# Patient Record
Sex: Female | Born: 1970 | Race: Black or African American | Hispanic: No | Marital: Single | Smoking: Current some day smoker
Health system: Southern US, Community
[De-identification: ages and names within clinical notes are randomized; demographics above are authoritative.]

## PROBLEM LIST (undated history)

## (undated) DIAGNOSIS — G8929 Other chronic pain: Secondary | ICD-10-CM

## (undated) DIAGNOSIS — M549 Dorsalgia, unspecified: Secondary | ICD-10-CM

## (undated) DIAGNOSIS — F419 Anxiety disorder, unspecified: Secondary | ICD-10-CM

## (undated) DIAGNOSIS — Z9229 Personal history of other drug therapy: Secondary | ICD-10-CM

## (undated) DIAGNOSIS — F329 Major depressive disorder, single episode, unspecified: Secondary | ICD-10-CM

## (undated) DIAGNOSIS — F32A Depression, unspecified: Secondary | ICD-10-CM

## (undated) HISTORY — DX: Personal history of other drug therapy: Z92.29

---

## 1998-01-26 ENCOUNTER — Emergency Department (HOSPITAL_COMMUNITY): Admission: EM | Admit: 1998-01-26 | Discharge: 1998-01-26 | Payer: Self-pay | Admitting: Emergency Medicine

## 1998-10-03 ENCOUNTER — Inpatient Hospital Stay (HOSPITAL_COMMUNITY): Admission: AD | Admit: 1998-10-03 | Discharge: 1998-10-03 | Payer: Self-pay | Admitting: *Deleted

## 1998-10-29 ENCOUNTER — Inpatient Hospital Stay (HOSPITAL_COMMUNITY): Admission: EM | Admit: 1998-10-29 | Discharge: 1998-10-31 | Payer: Self-pay | Admitting: Emergency Medicine

## 1998-10-29 ENCOUNTER — Encounter: Payer: Self-pay | Admitting: Emergency Medicine

## 1999-02-26 ENCOUNTER — Emergency Department (HOSPITAL_COMMUNITY): Admission: EM | Admit: 1999-02-26 | Discharge: 1999-02-26 | Payer: Self-pay | Admitting: Emergency Medicine

## 1999-03-03 ENCOUNTER — Emergency Department (HOSPITAL_COMMUNITY): Admission: EM | Admit: 1999-03-03 | Discharge: 1999-03-03 | Payer: Self-pay

## 1999-05-26 ENCOUNTER — Emergency Department (HOSPITAL_COMMUNITY): Admission: EM | Admit: 1999-05-26 | Discharge: 1999-05-26 | Payer: Self-pay

## 1999-07-09 ENCOUNTER — Encounter: Payer: Self-pay | Admitting: Emergency Medicine

## 1999-07-09 ENCOUNTER — Emergency Department (HOSPITAL_COMMUNITY): Admission: EM | Admit: 1999-07-09 | Discharge: 1999-07-09 | Payer: Self-pay | Admitting: Emergency Medicine

## 1999-07-13 ENCOUNTER — Emergency Department (HOSPITAL_COMMUNITY): Admission: EM | Admit: 1999-07-13 | Discharge: 1999-07-13 | Payer: Self-pay | Admitting: Emergency Medicine

## 1999-07-13 ENCOUNTER — Encounter: Payer: Self-pay | Admitting: Emergency Medicine

## 2000-11-08 ENCOUNTER — Emergency Department (HOSPITAL_COMMUNITY): Admission: EM | Admit: 2000-11-08 | Discharge: 2000-11-08 | Payer: Self-pay | Admitting: Emergency Medicine

## 2001-08-21 ENCOUNTER — Emergency Department (HOSPITAL_COMMUNITY): Admission: EM | Admit: 2001-08-21 | Discharge: 2001-08-21 | Payer: Self-pay | Admitting: Emergency Medicine

## 2003-07-05 ENCOUNTER — Emergency Department (HOSPITAL_COMMUNITY): Admission: EM | Admit: 2003-07-05 | Discharge: 2003-07-05 | Payer: Self-pay | Admitting: Emergency Medicine

## 2003-12-20 ENCOUNTER — Emergency Department (HOSPITAL_COMMUNITY): Admission: EM | Admit: 2003-12-20 | Discharge: 2003-12-20 | Payer: Self-pay | Admitting: Emergency Medicine

## 2005-01-17 ENCOUNTER — Emergency Department (HOSPITAL_COMMUNITY): Admission: EM | Admit: 2005-01-17 | Discharge: 2005-01-18 | Payer: Self-pay | Admitting: Emergency Medicine

## 2006-01-23 ENCOUNTER — Emergency Department (HOSPITAL_COMMUNITY): Admission: EM | Admit: 2006-01-23 | Discharge: 2006-01-23 | Payer: Self-pay | Admitting: Emergency Medicine

## 2006-09-18 ENCOUNTER — Ambulatory Visit (HOSPITAL_COMMUNITY): Admission: RE | Admit: 2006-09-18 | Discharge: 2006-09-18 | Payer: Self-pay | Admitting: Family Medicine

## 2006-11-17 ENCOUNTER — Ambulatory Visit (HOSPITAL_COMMUNITY): Admission: RE | Admit: 2006-11-17 | Discharge: 2006-11-17 | Payer: Self-pay | Admitting: Family Medicine

## 2007-01-12 ENCOUNTER — Ambulatory Visit: Payer: Self-pay | Admitting: *Deleted

## 2007-01-12 ENCOUNTER — Inpatient Hospital Stay (HOSPITAL_COMMUNITY): Admission: AD | Admit: 2007-01-12 | Discharge: 2007-01-14 | Payer: Self-pay | Admitting: Obstetrics & Gynecology

## 2009-03-21 ENCOUNTER — Emergency Department (HOSPITAL_COMMUNITY): Admission: EM | Admit: 2009-03-21 | Discharge: 2009-03-21 | Payer: Self-pay | Admitting: Emergency Medicine

## 2009-03-22 ENCOUNTER — Emergency Department (HOSPITAL_COMMUNITY): Admission: EM | Admit: 2009-03-22 | Discharge: 2009-03-22 | Payer: Self-pay | Admitting: Emergency Medicine

## 2010-11-12 LAB — CBC
HCT: 31.5 — ABNORMAL LOW
Hemoglobin: 10.7 — ABNORMAL LOW
MCHC: 34
MCV: 83.3
Platelets: 338
RBC: 3.79 — ABNORMAL LOW
RDW: 13.7
WBC: 8.7

## 2010-11-12 LAB — RPR: RPR Ser Ql: NONREACTIVE

## 2010-12-01 ENCOUNTER — Emergency Department (HOSPITAL_COMMUNITY)
Admission: EM | Admit: 2010-12-01 | Discharge: 2010-12-01 | Disposition: A | Discharge status: Home / Self Care | Payer: Medicaid Other | Attending: Emergency Medicine | Admitting: Emergency Medicine

## 2010-12-01 DIAGNOSIS — T887XXA Unspecified adverse effect of drug or medicament, initial encounter: Secondary | ICD-10-CM | POA: Insufficient documentation

## 2010-12-01 DIAGNOSIS — I10 Essential (primary) hypertension: Secondary | ICD-10-CM | POA: Insufficient documentation

## 2010-12-01 DIAGNOSIS — F17200 Nicotine dependence, unspecified, uncomplicated: Secondary | ICD-10-CM | POA: Insufficient documentation

## 2012-05-04 ENCOUNTER — Emergency Department (INDEPENDENT_AMBULATORY_CARE_PROVIDER_SITE_OTHER)
Admission: EM | Admit: 2012-05-04 | Discharge: 2012-05-04 | Disposition: A | Discharge status: Home / Self Care | Payer: Medicaid Other | Source: Home / Self Care | Attending: Family Medicine | Admitting: Family Medicine

## 2012-05-04 ENCOUNTER — Encounter (HOSPITAL_COMMUNITY): Payer: Self-pay | Admitting: Emergency Medicine

## 2012-05-04 DIAGNOSIS — M7711 Lateral epicondylitis, right elbow: Secondary | ICD-10-CM

## 2012-05-04 DIAGNOSIS — M7710 Lateral epicondylitis, unspecified elbow: Secondary | ICD-10-CM

## 2012-05-04 LAB — POCT PREGNANCY, URINE: Preg Test, Ur: NEGATIVE

## 2012-05-04 MED ORDER — LIDOCAINE 5 % EX PTCH: MEDICATED_PATCH | CUTANEOUS | Status: DC

## 2012-05-04 MED ORDER — IBUPROFEN 600 MG PO TABS: 600.0000 mg | ORAL_TABLET | Freq: Three times a day (TID) | ORAL | Status: DC | PRN

## 2012-05-04 MED ORDER — TRAMADOL HCL 50 MG PO TABS: 50.0000 mg | ORAL_TABLET | Freq: Three times a day (TID) | ORAL | Status: DC | PRN
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
## 2012-05-04 NOTE — ED Notes (Signed)
Sent to bathroom for specimen 

## 2012-05-04 NOTE — ED Notes (Signed)
Pharmacy reported insurance will not cover script.  Dr Alfonse Ras changed script to ointment 5%, apply dime size amount twice a day, 1 tube.

## 2012-05-04 NOTE — ED Notes (Signed)
Patient concerned she may be pregnant.
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
## 2012-05-20 ENCOUNTER — Encounter (HOSPITAL_COMMUNITY): Payer: Self-pay | Admitting: Family Medicine

## 2012-05-20 ENCOUNTER — Emergency Department (HOSPITAL_COMMUNITY)
Admission: EM | Admit: 2012-05-20 | Discharge: 2012-05-20 | Disposition: A | Discharge status: Home / Self Care | Payer: Medicaid Other | Attending: Emergency Medicine | Admitting: Emergency Medicine

## 2012-05-20 DIAGNOSIS — N390 Urinary tract infection, site not specified: Secondary | ICD-10-CM

## 2012-05-20 DIAGNOSIS — R350 Frequency of micturition: Secondary | ICD-10-CM | POA: Insufficient documentation

## 2012-05-20 DIAGNOSIS — N898 Other specified noninflammatory disorders of vagina: Secondary | ICD-10-CM | POA: Insufficient documentation

## 2012-05-20 DIAGNOSIS — Z3202 Encounter for pregnancy test, result negative: Secondary | ICD-10-CM | POA: Insufficient documentation

## 2012-05-20 DIAGNOSIS — F17200 Nicotine dependence, unspecified, uncomplicated: Secondary | ICD-10-CM | POA: Insufficient documentation

## 2012-05-20 LAB — COMPREHENSIVE METABOLIC PANEL
ALT: 12 U/L (ref 0–35)
AST: 22 U/L (ref 0–37)
Albumin: 3.7 g/dL (ref 3.5–5.2)
Alkaline Phosphatase: 62 U/L (ref 39–117)
BUN: 8 mg/dL (ref 6–23)
CO2: 28 mEq/L (ref 19–32)
Calcium: 9.5 mg/dL (ref 8.4–10.5)
Chloride: 101 mEq/L (ref 96–112)
Creatinine, Ser: 0.84 mg/dL (ref 0.50–1.10)
GFR calc Af Amer: >90 mL/min (ref >90)
GFR calc non Af Amer: 86 mL/min — ABNORMAL LOW (ref >90)
Glucose, Bld: 113 mg/dL — ABNORMAL HIGH (ref 70–99)
Potassium: 4 mEq/L (ref 3.5–5.1)
Total Bilirubin: 0.3 mg/dL (ref 0.3–1.2)
Total Protein: 7.7 g/dL (ref 6.0–8.3)

## 2012-05-20 LAB — CBC WITH DIFFERENTIAL/PLATELET
Basophils Absolute: 0 10*3/uL (ref 0.0–0.1)
Basophils Relative: 0 % (ref 0–1)
Eosinophils Relative: 0 % (ref 0–5)
HCT: 39.2 % (ref 36.0–46.0)
Hemoglobin: 13.3 g/dL (ref 12.0–15.0)
Lymphocytes Relative: 23 % (ref 12–46)
Lymphs Abs: 2.2 10*3/uL (ref 0.7–4.0)
MCH: 28.2 pg (ref 26.0–34.0)
MCHC: 33.9 g/dL (ref 30.0–36.0)
MCV: 83.1 fL (ref 78.0–100.0)
Monocytes Absolute: 0.6 10*3/uL (ref 0.1–1.0)
Monocytes Relative: 6 % (ref 3–12)
Neutro Abs: 6.5 10*3/uL (ref 1.7–7.7)
Neutrophils Relative %: 70 % (ref 43–77)
Platelets: 312 10*3/uL (ref 150–400)
RBC: 4.72 MIL/uL (ref 3.87–5.11)
RDW: 13.7 % (ref 11.5–15.5)
WBC: 9.3 10*3/uL (ref 4.0–10.5)

## 2012-05-20 LAB — URINE MICROSCOPIC-ADD ON

## 2012-05-20 LAB — WET PREP, GENITAL
Trich, Wet Prep: NONE SEEN
WBC, Wet Prep HPF POC: NONE SEEN
Yeast Wet Prep HPF POC: NONE SEEN

## 2012-05-20 LAB — URINALYSIS, ROUTINE W REFLEX MICROSCOPIC
Glucose, UA: NEGATIVE mg/dL
Hgb urine dipstick: NEGATIVE
Ketones, ur: 15 mg/dL — AB
Leukocytes, UA: NEGATIVE
Nitrite: POSITIVE — AB
Protein, ur: NEGATIVE mg/dL
Specific Gravity, Urine: 1.023 (ref 1.005–1.030)
Urobilinogen, UA: 1 mg/dL (ref 0.0–1.0)

## 2012-05-20 MED ORDER — CIPROFLOXACIN HCL 500 MG PO TABS: 500.0000 mg | ORAL_TABLET | Freq: Two times a day (BID) | ORAL | Status: DC

## 2012-05-20 MED ORDER — PHENAZOPYRIDINE HCL 200 MG PO TABS: 200.0000 mg | ORAL_TABLET | Freq: Three times a day (TID) | ORAL | Status: DC

## 2012-05-20 NOTE — ED Provider Notes (Signed)
Medical screening examination/treatment/procedure(s) were performed by non-physician practitioner and as supervising physician I was immediately available for consultation/collaboration.   Dione Booze, MD 05/20/12 2017

## 2012-05-20 NOTE — ED Notes (Signed)
Pt reports bilateral flank pain since 0900 today that radiates forward to pelvis. Denies dysuria, hematuria but reports frequency. Pt concerned she could be pregnant. Denies N/V/D. States her pain is better when she lays down on her right, but incredibly worse when she lays on her left.

## 2012-05-20 NOTE — ED Notes (Signed)
Per pt sts abdominal pain all over that started this am. sts February last menstrual. sts last BM yesterday and normal. Denies N,V,D.

## 2012-05-20 NOTE — ED Notes (Signed)
PA at bedside.

## 2012-05-20 NOTE — ED Notes (Signed)
Pelvic cart at bedside. 

## 2012-05-20 NOTE — ED Provider Notes (Signed)
History     CSN: [id number]  Arrival date & time 05/20/12  1445   First MD Initiated Contact with Patient 05/20/12 [phone]      Chief Complaint  Patient presents with  . Abdominal Pain    (Consider location/radiation/quality/duration/timing/severity/associated sxs/prior treatment) HPI Comments: Patient presents to the emergency department for diffuse, cramping, abdominal pain of sudden onset this morning. Patient notes she has had increased frequency of urination as well as new vaginal discharge. Discharge is clear in appearance and without odor. New sexual partner beginning March 2014.  Pt is unsure of STD status at this time.  Patient denies any dysuria, nausea, vomiting, diarrhea, sweats, or chills.  Reports recent fever of 102F, easily relieved with Tylenol.  LMP February 2014.  No history of kidney stones.  The history is provided by the patient.    History reviewed. No pertinent past medical history.  History reviewed. No pertinent past surgical history.  History reviewed. No pertinent family history.  History  Substance Use Topics  . Smoking status: Current Every Day Smoker  . Smokeless tobacco: Not on file  . Alcohol Use: No    OB History   Grav Para Term Preterm Abortions TAB SAB Ect Mult Living                  Review of Systems  Gastrointestinal: Positive for abdominal pain.  Genitourinary: Positive for frequency and flank pain.  All other systems reviewed and are negative.    Allergies  Flexeril and Penicillins  Home Medications   Current Outpatient Rx  Name  Route  Sig  Dispense  Refill  . ibuprofen (ADVIL,MOTRIN) 600 MG tablet   Oral   Take 1 tablet (600 mg total) by mouth every 8 (eight) hours as needed for pain.   20 tablet   0   . lidocaine (LIDODERM) 5 %      Cut patch in half an apply over the affected area Remove & Discard patch before applying new one in 12 hours.   7 patch   0   . traMADol (ULTRAM) 50 MG tablet   Oral   Take 1  tablet (50 mg total) by mouth every 8 (eight) hours as needed for pain.   20 tablet   0     BP 103/65  Pulse 85  Temp(Src) 97.8 F (36.6 C) (Oral)  Resp 18  SpO2 96%  LMP 03/16/2012  Physical Exam  Nursing note and vitals reviewed. Constitutional: She is oriented to person, place, and time. She appears well-developed and well-nourished.  HENT:  Head: Normocephalic and atraumatic.  Mouth/Throat: Oropharynx is clear and moist.  Eyes: Conjunctivae and EOM are normal. Pupils are equal, round, and reactive to light.  Neck: Normal range of motion.  Cardiovascular: Normal rate, regular rhythm and normal heart sounds.   Pulmonary/Chest: Effort normal and breath sounds normal.  Abdominal: Soft. Bowel sounds are normal. There is generalized tenderness. There is CVA tenderness (R > L). There is no tenderness at McBurney's point and negative Murphy's sign.  Genitourinary: Vagina normal. There is no lesion on the right labia. There is no lesion on the left labia. Cervix exhibits no motion tenderness. Right adnexum displays no mass and no tenderness. Left adnexum displays no mass and no tenderness. No tenderness around the vagina.  Musculoskeletal: Normal range of motion.  Neurological: She is alert and oriented to person, place, and time.  Skin: Skin is warm and dry.  Psychiatric: She has a normal mood  and affect.    ED Course  Procedures (including critical care time)  Labs Reviewed  WET PREP, GENITAL - Abnormal; Notable for the following:    Clue Cells Wet Prep HPF POC FEW (*)    All other components within normal limits  COMPREHENSIVE METABOLIC PANEL - Abnormal; Notable for the following:    Glucose, Bld 113 (*)    GFR calc non Af Amer 86 (*)    All other components within normal limits  URINALYSIS, ROUTINE W REFLEX MICROSCOPIC - Abnormal; Notable for the following:    APPearance CLOUDY (*)    Ketones, ur 15 (*)    Nitrite POSITIVE (*)    All other components within normal limits   URINE MICROSCOPIC-ADD ON - Abnormal; Notable for the following:    Bacteria, UA MANY (*)    All other components within normal limits  GC/CHLAMYDIA PROBE AMP  URINE CULTURE  CBC WITH DIFFERENTIAL  LIPASE, BLOOD  POCT PREGNANCY, URINE   No results found.   1. Urinary tract infection       MDM   Patient presents to the emergency department for diffuse abdominal pain of sudden onset this am.  Also notes increased urinary frequency and new vaginal discharge.  New sexual partner for the past month.   U-preg negative.  U/a nitrite +, culture pending.  Wet prep with few clue cells, however i do not feel this indicates infection.  Pt afebrile, non-toxic appearing, VS stable- will d/c home with tx for UTI.  Rx cipro and pyridium.  Advised pt she will be notified if her culture results are abnormal or require treatment.  Discussed plan with pt- she agreed.  Return precautions advised.        Garlon Hatchet, PA-C 05/20/12 1836

## 2012-05-21 LAB — GC/CHLAMYDIA PROBE AMP
CT Probe RNA: NEGATIVE
GC Probe RNA: NEGATIVE

## 2012-05-22 LAB — URINE CULTURE: Colony Count: >=100000

## 2012-05-23 ENCOUNTER — Telehealth (HOSPITAL_COMMUNITY): Payer: Self-pay | Admitting: Emergency Medicine

## 2012-05-23 NOTE — ED Notes (Signed)
Positive urnc- treated per protocol.  

## 2012-07-13 ENCOUNTER — Emergency Department (HOSPITAL_COMMUNITY)
Admission: EM | Admit: 2012-07-13 | Discharge: 2012-07-13 | Discharge status: Against Medical Advice | Payer: Medicaid Other | Attending: Emergency Medicine | Admitting: Emergency Medicine

## 2012-07-13 ENCOUNTER — Encounter (HOSPITAL_COMMUNITY): Payer: Self-pay | Admitting: *Deleted

## 2012-07-13 ENCOUNTER — Emergency Department (HOSPITAL_COMMUNITY): Payer: Medicaid Other

## 2012-07-13 DIAGNOSIS — H538 Other visual disturbances: Secondary | ICD-10-CM | POA: Insufficient documentation

## 2012-07-13 DIAGNOSIS — IMO0002 Reserved for concepts with insufficient information to code with codable children: Secondary | ICD-10-CM | POA: Insufficient documentation

## 2012-07-13 DIAGNOSIS — Z3202 Encounter for pregnancy test, result negative: Secondary | ICD-10-CM | POA: Insufficient documentation

## 2012-07-13 DIAGNOSIS — S59909A Unspecified injury of unspecified elbow, initial encounter: Secondary | ICD-10-CM | POA: Insufficient documentation

## 2012-07-13 DIAGNOSIS — Z880 Allergy status to penicillin: Secondary | ICD-10-CM | POA: Insufficient documentation

## 2012-07-13 DIAGNOSIS — S59919A Unspecified injury of unspecified forearm, initial encounter: Secondary | ICD-10-CM | POA: Insufficient documentation

## 2012-07-13 DIAGNOSIS — S0990XA Unspecified injury of head, initial encounter: Secondary | ICD-10-CM | POA: Insufficient documentation

## 2012-07-13 DIAGNOSIS — S6990XA Unspecified injury of unspecified wrist, hand and finger(s), initial encounter: Secondary | ICD-10-CM | POA: Insufficient documentation

## 2012-07-13 DIAGNOSIS — R42 Dizziness and giddiness: Secondary | ICD-10-CM | POA: Insufficient documentation

## 2012-07-13 DIAGNOSIS — F17200 Nicotine dependence, unspecified, uncomplicated: Secondary | ICD-10-CM | POA: Insufficient documentation

## 2012-07-13 LAB — POCT PREGNANCY, URINE: Preg Test, Ur: NEGATIVE

## 2012-07-13 MED ORDER — LORAZEPAM 1 MG PO TABS
1.0000 mg | ORAL_TABLET | Freq: Once | ORAL | Status: AC
  Administered 2012-07-13: 1 mg via ORAL
  Filled 2012-07-13: qty 1

## 2012-07-13 MED ORDER — OXYCODONE-ACETAMINOPHEN 5-325 MG PO TABS
2.0000 | ORAL_TABLET | Freq: Once | ORAL | Status: AC
  Administered 2012-07-13: 2 via ORAL
  Filled 2012-07-13: qty 2
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
## 2012-07-13 NOTE — ED Notes (Signed)
Pt states she's unsure of when last period was, states thinks it was march, unsure if she's pregnant.
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
## 2012-07-13 NOTE — ED Notes (Signed)
Pt left AMA, states "I have a 40 year old to get home to and I need to get on the bus and get home, I can't stay", pt walked out of room and left, pt made aware she had xrays and CT scan ordered, pt states "I will just have to come back tomorrow for that, I have to get home now". Pt ambulated w/o difficulty when leaving. Melina Schools, PA made aware pt left AMA.
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
## 2012-07-14 ENCOUNTER — Ambulatory Visit (HOSPITAL_COMMUNITY): Admission: RE | Admit: 2012-07-14 | Payer: Medicaid Other | Source: Ambulatory Visit

## 2012-07-16 NOTE — ED Provider Notes (Signed)
Medical screening examination/treatment/procedure(s) were performed by non-physician practitioner and as supervising physician I was immediately available for consultation/collaboration.  Raeford Razor, MD 07/16/12 [phone]

## 2012-07-20 ENCOUNTER — Emergency Department (HOSPITAL_COMMUNITY): Payer: Medicaid Other

## 2012-07-20 ENCOUNTER — Encounter (HOSPITAL_COMMUNITY): Payer: Self-pay | Admitting: Emergency Medicine

## 2012-07-20 ENCOUNTER — Emergency Department (HOSPITAL_COMMUNITY)
Admission: EM | Admit: 2012-07-20 | Discharge: 2012-07-21 | Disposition: A | Discharge status: Home / Self Care | Payer: Medicaid Other | Attending: Emergency Medicine | Admitting: Emergency Medicine

## 2012-07-20 DIAGNOSIS — S6980XA Other specified injuries of unspecified wrist, hand and finger(s), initial encounter: Secondary | ICD-10-CM | POA: Insufficient documentation

## 2012-07-20 DIAGNOSIS — Z760 Encounter for issue of repeat prescription: Secondary | ICD-10-CM | POA: Insufficient documentation

## 2012-07-20 DIAGNOSIS — M79645 Pain in left finger(s): Secondary | ICD-10-CM

## 2012-07-20 DIAGNOSIS — F329 Major depressive disorder, single episode, unspecified: Secondary | ICD-10-CM

## 2012-07-20 DIAGNOSIS — Z79899 Other long term (current) drug therapy: Secondary | ICD-10-CM | POA: Insufficient documentation

## 2012-07-20 DIAGNOSIS — Y939 Activity, unspecified: Secondary | ICD-10-CM | POA: Insufficient documentation

## 2012-07-20 DIAGNOSIS — Y929 Unspecified place or not applicable: Secondary | ICD-10-CM | POA: Insufficient documentation

## 2012-07-20 DIAGNOSIS — S6990XA Unspecified injury of unspecified wrist, hand and finger(s), initial encounter: Secondary | ICD-10-CM | POA: Insufficient documentation

## 2012-07-20 DIAGNOSIS — M25551 Pain in right hip: Secondary | ICD-10-CM

## 2012-07-20 DIAGNOSIS — S79919A Unspecified injury of unspecified hip, initial encounter: Secondary | ICD-10-CM | POA: Insufficient documentation

## 2012-07-20 DIAGNOSIS — F411 Generalized anxiety disorder: Secondary | ICD-10-CM | POA: Insufficient documentation

## 2012-07-20 DIAGNOSIS — F3289 Other specified depressive episodes: Secondary | ICD-10-CM | POA: Insufficient documentation

## 2012-07-20 DIAGNOSIS — F419 Anxiety disorder, unspecified: Secondary | ICD-10-CM

## 2012-07-20 DIAGNOSIS — F17200 Nicotine dependence, unspecified, uncomplicated: Secondary | ICD-10-CM | POA: Insufficient documentation

## 2012-07-20 DIAGNOSIS — W2203XA Walked into furniture, initial encounter: Secondary | ICD-10-CM | POA: Insufficient documentation

## 2012-07-20 MED ORDER — HYDROCODONE-ACETAMINOPHEN 5-325 MG PO TABS
1.0000 | ORAL_TABLET | Freq: Once | ORAL | Status: AC
  Administered 2012-07-20: 1 via ORAL
  Filled 2012-07-20: qty 1

## 2012-07-20 NOTE — ED Notes (Signed)
Patient transported to X-ray 
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
## 2012-07-21 MED ORDER — HYDROCODONE-ACETAMINOPHEN 5-325 MG PO TABS
1.0000 | ORAL_TABLET | Freq: Once | ORAL | Status: AC
  Administered 2012-07-21: 1 via ORAL
  Filled 2012-07-21: qty 1

## 2012-07-21 MED ORDER — FLUOXETINE HCL 20 MG PO TABS: 20.0000 mg | ORAL_TABLET | Freq: Every day | ORAL | Status: DC

## 2012-07-21 MED ORDER — TRAMADOL HCL 50 MG PO TABS: 50.0000 mg | ORAL_TABLET | Freq: Four times a day (QID) | ORAL | Status: DC | PRN

## 2012-07-21 MED ORDER — ALPRAZOLAM 1 MG PO TABS: 1.0000 mg | ORAL_TABLET | Freq: Every evening | ORAL | Status: DC | PRN

## 2012-07-21 NOTE — Progress Notes (Signed)
Orthopedic Tech Progress Note Patient Details:  Briana Harris 02-27-1970 [id number]  Ortho Devices Type of Ortho Device: Finger splint   Haskell Flirt 07/21/2012, 12:33 AM

## 2012-07-23 NOTE — ED Provider Notes (Signed)
Medical screening examination/treatment/procedure(s) were performed by non-physician practitioner and as supervising physician I was immediately available for consultation/collaboration.   Tiajuana Leppanen, MD 07/23/12 0709 

## 2012-12-02 ENCOUNTER — Encounter (HOSPITAL_COMMUNITY): Payer: Self-pay | Admitting: Emergency Medicine

## 2012-12-02 ENCOUNTER — Emergency Department (HOSPITAL_COMMUNITY)
Admission: EM | Admit: 2012-12-02 | Discharge: 2012-12-02 | Disposition: A | Discharge status: Home / Self Care | Payer: Medicaid Other | Attending: Emergency Medicine | Admitting: Emergency Medicine

## 2012-12-02 ENCOUNTER — Emergency Department (HOSPITAL_COMMUNITY): Payer: Medicaid Other

## 2012-12-02 DIAGNOSIS — S62639A Displaced fracture of distal phalanx of unspecified finger, initial encounter for closed fracture: Secondary | ICD-10-CM

## 2012-12-02 DIAGNOSIS — S6390XA Sprain of unspecified part of unspecified wrist and hand, initial encounter: Secondary | ICD-10-CM | POA: Insufficient documentation

## 2012-12-02 DIAGNOSIS — F411 Generalized anxiety disorder: Secondary | ICD-10-CM | POA: Insufficient documentation

## 2012-12-02 DIAGNOSIS — S60312A Abrasion of left thumb, initial encounter: Secondary | ICD-10-CM

## 2012-12-02 DIAGNOSIS — Z79899 Other long term (current) drug therapy: Secondary | ICD-10-CM | POA: Insufficient documentation

## 2012-12-02 DIAGNOSIS — F17200 Nicotine dependence, unspecified, uncomplicated: Secondary | ICD-10-CM | POA: Insufficient documentation

## 2012-12-02 DIAGNOSIS — Z880 Allergy status to penicillin: Secondary | ICD-10-CM | POA: Insufficient documentation

## 2012-12-02 DIAGNOSIS — S62609A Fracture of unspecified phalanx of unspecified finger, initial encounter for closed fracture: Secondary | ICD-10-CM | POA: Insufficient documentation

## 2012-12-02 DIAGNOSIS — Y929 Unspecified place or not applicable: Secondary | ICD-10-CM | POA: Insufficient documentation

## 2012-12-02 DIAGNOSIS — S63602A Unspecified sprain of left thumb, initial encounter: Secondary | ICD-10-CM

## 2012-12-02 DIAGNOSIS — Y9372 Activity, wrestling: Secondary | ICD-10-CM | POA: Insufficient documentation

## 2012-12-02 DIAGNOSIS — IMO0002 Reserved for concepts with insufficient information to code with codable children: Secondary | ICD-10-CM | POA: Insufficient documentation

## 2012-12-02 DIAGNOSIS — W2209XA Striking against other stationary object, initial encounter: Secondary | ICD-10-CM | POA: Insufficient documentation

## 2012-12-02 HISTORY — DX: Anxiety disorder, unspecified: F41.9

## 2012-12-02 MED ORDER — IBUPROFEN 800 MG PO TABS: 800.0000 mg | ORAL_TABLET | Freq: Three times a day (TID) | ORAL | Status: DC

## 2012-12-02 MED ORDER — IBUPROFEN 400 MG PO TABS
800.0000 mg | ORAL_TABLET | Freq: Once | ORAL | Status: AC
  Administered 2012-12-02: 800 mg via ORAL
  Filled 2012-12-02: qty 2

## 2012-12-02 MED ORDER — HYDROCODONE-ACETAMINOPHEN 5-325 MG PO TABS
1.0000 | ORAL_TABLET | Freq: Once | ORAL | Status: AC
  Administered 2012-12-02: 1 via ORAL
  Filled 2012-12-02: qty 1

## 2012-12-02 NOTE — ED Provider Notes (Signed)
CSN: [id number]     Arrival date & time 12/02/12  1758 History  This chart was scribed for non-physician practitioner Dierdre Forth, PA-C working with Audree Camel, MD by Danella Maiers, ED Scribe. This patient was seen in room TR04C/TR04C and the patient's care was started at 6:57 PM.   Chief Complaint  Patient presents with  . Hand Injury   The history is provided by the patient. No language interpreter was used.   HPI Comments: Briana Harris is a 42 y.o. female who presents to the Emergency Department complaining of constant, throbbing pain to her left thumb, left 3rd, and left 4th fingers after knocking her hand against the wall while wrestling with her boyfriend two hours ago. She denies pain or injury anywhere else. She has not taken anything for the pain. Movement and palpation makes the pain much worse on holding still makes it better.  She did not hit her loss of consciousness. Patient denies neck or back pain.  Past Medical History  Diagnosis Date  . Anxiety    History reviewed. No pertinent past surgical history. No family history on file. History  Substance Use Topics  . Smoking status: Current Every Day Smoker  . Smokeless tobacco: Never Used  . Alcohol Use: Yes   OB History   Grav Para Term Preterm Abortions TAB SAB Ect Mult Living                 Review of Systems  Constitutional: Negative for fever, diaphoresis, appetite change, fatigue and unexpected weight change.  HENT: Negative for mouth sores.   Eyes: Negative for visual disturbance.  Respiratory: Negative for cough, chest tightness, shortness of breath and wheezing.   Cardiovascular: Negative for chest pain.  Gastrointestinal: Negative for nausea, vomiting, abdominal pain, diarrhea and constipation.  Endocrine: Negative for polydipsia, polyphagia and polyuria.  Genitourinary: Negative for dysuria, urgency, frequency and hematuria.  Musculoskeletal: Positive for arthralgias (left hand). Negative for  back pain and neck stiffness.  Skin: Negative for rash.  Allergic/Immunologic: Negative for immunocompromised state.  Neurological: Negative for syncope, light-headedness and headaches.  Hematological: Does not bruise/bleed easily.  Psychiatric/Behavioral: Negative for sleep disturbance. The patient is not nervous/anxious.     Allergies  Flexeril; Lactose intolerance (gi); and Penicillins  Home Medications   Current Outpatient Rx  Name  Route  Sig  Dispense  Refill  . ALPRAZolam (XANAX) 1 MG tablet   Oral   Take 1 tablet (1 mg total) by mouth at bedtime as needed for sleep.   7 tablet   0   . FLUoxetine (PROZAC) 20 MG tablet   Oral   Take 1 tablet (20 mg total) by mouth daily.   7 tablet   0   . ibuprofen (ADVIL,MOTRIN) 800 MG tablet   Oral   Take 1 tablet (800 mg total) by mouth 3 (three) times daily.   21 tablet   0    BP 131/80  Pulse 74  Temp(Src) 98.3 F (36.8 C) (Oral)  Resp 16  Wt 107 lb 8 oz (48.762 kg)  SpO2 98% Physical Exam  Nursing note and vitals reviewed. Constitutional: She appears well-developed and well-nourished. No distress.  HENT:  Head: Normocephalic and atraumatic.  Eyes: Conjunctivae are normal.  Neck: Normal range of motion.  Cardiovascular: Normal rate, regular rhythm and intact distal pulses.   Capillary refill less than 3 sec  Pulmonary/Chest: Effort normal and breath sounds normal.  Musculoskeletal: She exhibits tenderness. She exhibits no edema.  ROM: sesnation in all fingers. decreased strength in thumb and ring finger due to pain but normal strength in all other fingers. Mildly limited ROM to the PIP of the ring finger and NCP of the thumb. Swelling and ecchymosis in both these fingers. Small abrasion of left thumb on the palmar side.  Neurological: She is alert. Coordination normal.  Sensation intact Strength 5/5  Skin: Skin is warm and dry. She is not diaphoretic.  No tenting of the skin  Psychiatric: She has a normal mood and  affect.    ED Course  Procedures (including critical care time) Medications  ibuprofen (ADVIL,MOTRIN) tablet 800 mg (800 mg Oral Given 12/02/12 2003)  HYDROcodone-acetaminophen (NORCO/VICODIN) 5-325 MG per tablet 1 tablet (1 tablet Oral Given 12/02/12 2003)    DIAGNOSTIC STUDIES: Oxygen Saturation is 98% on RA, normal by my interpretation.    COORDINATION OF CARE: 7:23 PM- Discussed treatment plan with pt which includes hand x-ray. Pt agrees to plan.    Labs Review Labs Reviewed - No data to display Imaging Review Dg Hand Complete Left  12/02/2012   CLINICAL DATA:  Left hand pain. Swelling of the left ring finger.  EXAM: LEFT HAND - COMPLETE 3+ VIEW  COMPARISON:  Left hand radiographs 07/20/2012  FINDINGS: The patient had difficulty extending the 4th digit (required assistance from the technologist (. The joints of the hand are aligned. There is a 2 x 2 mm bony fragment along the dorsal side of the 4th finger at the level of the distal interphalangeal joint. This fracture fragment likely arose from the distal phalanx. No additional fractures are identified.  IMPRESSION: Small fracture fragment adjacent to the distal interphalangeal joint of the 4th finger, likely arising from the base of the distal phalanx. Patient has difficulty extending the 4th finger.   Electronically Signed   By: Britta Mccreedy M.D.   On: 12/02/2012 19:41    EKG Interpretation   None       MDM   1. Fracture of finger, distal phalanx, left, closed, initial encounter   2. Abrasion of left thumb, initial encounter   3. Left thumb sprain, initial encounter     Briana Harris presents with left hand injury.  Patient X-Ray with small fracture fragments from the base of the distal phalanx of the fourth finger on the left hand, no further fractures noted on the hand.  I personally reviewed the imaging tests through PACS system.  I reviewed available ER/hospitalization records through the EMR.  Pain managed in ED. she  is neurovascularly intact. Pt advised to follow up with hand orthopedics for further evaluation and treatment.  Pain managed in the department. Patient given finger splint, thumb spica and arm sling while in ED, conservative therapy recommended and discussed. Patient will be dc home & is agreeable with above plan. I have also discussed reasons to return immediately to the ER.  Patient expresses understanding and agrees with plan.  It has been determined that no acute conditions requiring further emergency intervention are present at this time. The patient/guardian have been advised of the diagnosis and plan. We have discussed signs and symptoms that warrant return to the ED, such as changes or worsening in symptoms.   Vital signs are stable at discharge.   BP 131/80  Pulse 74  Temp(Src) 98.3 F (36.8 C) (Oral)  Resp 16  Wt 107 lb 8 oz (48.762 kg)  SpO2 98%  Patient/guardian has voiced understanding and agreed to follow-up with the PCP  or specialist.    I personally performed the services described in this documentation, which was scribed in my presence. The recorded information has been reviewed and is accurate.    Dahlia Client Jackquelyn Sundberg, PA-C 12/02/12 2037

## 2012-12-02 NOTE — ED Notes (Signed)
Has small open wound left thumb.

## 2012-12-02 NOTE — Progress Notes (Signed)
Orthopedic Tech Progress Note Patient Details:  Briana Harris [birth year] [id number]  Ortho Devices Type of Ortho Device: Finger splint;Thumb velcro splint Ortho Device/Splint Location: lue Ortho Device/Splint Interventions: Application   Briana Harris 12/02/2012, 8:25 PM

## 2012-12-02 NOTE — ED Notes (Signed)
The pt has lt hand pain .  She was wrestling with her boyfriend and has pain in her lt thumb middle and ring fingers

## 2012-12-04 NOTE — ED Provider Notes (Signed)
Medical screening examination/treatment/procedure(s) were performed by non-physician practitioner and as supervising physician I was immediately available for consultation/collaboration.  EKG Interpretation   None         Jahaan Vanwagner T Jay Kempe, MD 12/04/12 1736 

## 2012-12-11 ENCOUNTER — Emergency Department (HOSPITAL_COMMUNITY)
Admission: EM | Admit: 2012-12-11 | Discharge: 2012-12-11 | Discharge status: Against Medical Advice | Payer: Medicaid Other | Attending: Emergency Medicine | Admitting: Emergency Medicine

## 2012-12-11 ENCOUNTER — Encounter (HOSPITAL_COMMUNITY): Payer: Self-pay | Admitting: Emergency Medicine

## 2012-12-11 DIAGNOSIS — Z4800 Encounter for change or removal of nonsurgical wound dressing: Secondary | ICD-10-CM | POA: Insufficient documentation

## 2012-12-11 DIAGNOSIS — Z3202 Encounter for pregnancy test, result negative: Secondary | ICD-10-CM | POA: Insufficient documentation

## 2012-12-11 LAB — POCT PREGNANCY, URINE: Preg Test, Ur: NEGATIVE

## 2012-12-11 NOTE — ED Notes (Signed)
PT c/o vaginal dryness, states did not want to tell triage RN this and therefore initially stated wanted to have dressing changed. Pt reports having no menses since August, vaginal dryness x1 year. Pt denies vaginal itching/abnormal discharge or burning on urination. PA made aware

## 2012-12-11 NOTE — ED Notes (Signed)
Patient presents today requesting her bandage to be rewrapped and evaluated. Patient injured finger several weeks ago reports she fractured finger.

## 2014-04-13 ENCOUNTER — Emergency Department (HOSPITAL_COMMUNITY)
Admission: EM | Admit: 2014-04-13 | Discharge: 2014-04-13 | Disposition: A | Discharge status: Home / Self Care | Payer: Medicaid Other | Attending: Emergency Medicine | Admitting: Emergency Medicine

## 2014-04-13 ENCOUNTER — Emergency Department (HOSPITAL_COMMUNITY): Payer: Medicaid Other

## 2014-04-13 ENCOUNTER — Encounter (HOSPITAL_COMMUNITY): Payer: Self-pay | Admitting: Emergency Medicine

## 2014-04-13 DIAGNOSIS — J988 Other specified respiratory disorders: Secondary | ICD-10-CM | POA: Insufficient documentation

## 2014-04-13 DIAGNOSIS — R05 Cough: Secondary | ICD-10-CM | POA: Diagnosis present

## 2014-04-13 DIAGNOSIS — Z720 Tobacco use: Secondary | ICD-10-CM | POA: Insufficient documentation

## 2014-04-13 DIAGNOSIS — R51 Headache: Secondary | ICD-10-CM | POA: Insufficient documentation

## 2014-04-13 DIAGNOSIS — F419 Anxiety disorder, unspecified: Secondary | ICD-10-CM | POA: Insufficient documentation

## 2014-04-13 DIAGNOSIS — Z760 Encounter for issue of repeat prescription: Secondary | ICD-10-CM | POA: Diagnosis not present

## 2014-04-13 DIAGNOSIS — F329 Major depressive disorder, single episode, unspecified: Secondary | ICD-10-CM | POA: Insufficient documentation

## 2014-04-13 DIAGNOSIS — R109 Unspecified abdominal pain: Secondary | ICD-10-CM | POA: Insufficient documentation

## 2014-04-13 DIAGNOSIS — Z79899 Other long term (current) drug therapy: Secondary | ICD-10-CM | POA: Insufficient documentation

## 2014-04-13 DIAGNOSIS — Z880 Allergy status to penicillin: Secondary | ICD-10-CM | POA: Insufficient documentation

## 2014-04-13 HISTORY — DX: Major depressive disorder, single episode, unspecified: F32.9

## 2014-04-13 HISTORY — DX: Depression, unspecified: F32.A

## 2014-04-13 LAB — I-STAT CHEM 8, ED
BUN: 11 mg/dL (ref 6–23)
Calcium, Ion: 1.09 mmol/L — ABNORMAL LOW (ref 1.12–1.23)
Chloride: 99 mmol/L (ref 96–112)
Creatinine, Ser: 1 mg/dL (ref 0.50–1.10)
Glucose, Bld: 70 mg/dL (ref 70–99)
HCT: 42 % (ref 36.0–46.0)
Hemoglobin: 14.3 g/dL (ref 12.0–15.0)
Potassium: 3.9 mmol/L (ref 3.5–5.1)
Sodium: 134 mmol/L — ABNORMAL LOW (ref 135–145)
TCO2: 20 mmol/L (ref 0–100)

## 2014-04-13 MED ORDER — BENZONATATE 100 MG PO CAPS
100.0000 mg | ORAL_CAPSULE | Freq: Once | ORAL | Status: AC
  Administered 2014-04-13: 100 mg via ORAL
  Filled 2014-04-13: qty 1

## 2014-04-13 MED ORDER — ONDANSETRON 8 MG PO TBDP
8.0000 mg | ORAL_TABLET | Freq: Once | ORAL | Status: AC
  Administered 2014-04-13: 8 mg via ORAL
  Filled 2014-04-13: qty 1

## 2014-04-13 MED ORDER — AZITHROMYCIN 250 MG PO TABS
500.0000 mg | ORAL_TABLET | Freq: Once | ORAL | Status: AC
  Administered 2014-04-13: 500 mg via ORAL
  Filled 2014-04-13: qty 2

## 2014-04-13 MED ORDER — AZITHROMYCIN 250 MG PO TABS: 250.0000 mg | ORAL_TABLET | Freq: Every day | ORAL | Status: DC

## 2014-04-13 MED ORDER — ONDANSETRON HCL 4 MG PO TABS: 4.0000 mg | ORAL_TABLET | Freq: Three times a day (TID) | ORAL | Status: DC | PRN

## 2014-04-13 MED ORDER — ALBUTEROL SULFATE HFA 108 (90 BASE) MCG/ACT IN AERS
1.0000 | INHALATION_SPRAY | Freq: Four times a day (QID) | RESPIRATORY_TRACT | Status: DC
  Administered 2014-04-13: 1 via RESPIRATORY_TRACT
  Filled 2014-04-13: qty 6.7

## 2014-04-13 MED ORDER — BENZONATATE 100 MG PO CAPS: 100.0000 mg | ORAL_CAPSULE | Freq: Three times a day (TID) | ORAL | Status: DC

## 2014-04-13 NOTE — ED Provider Notes (Signed)
CSN: [id number]     Arrival date & time 04/13/14  1617 History   First MD Initiated Contact with Patient 04/13/14 1705     Chief Complaint  Patient presents with  . Cough  . flu like sx   . Medication Refill   HPI Comments: 44 YOF presents with 2 days of headache, fevers/chills, rhinorrhea, cough, abdominal pain, n/v, and body aches. She reports symptoms started all at once, and have not improved. She notes one episode of non-bloody vomiting today. She also notes that she has been having difficulty breathing due to nasal congestion. She describes that headache as diffuse pressure; denies photophobia, phonophobia, changes in vision, or any other sensory deficits. Denies neck stiffness. She reports a decreased appetite but states that she has been able to maintain adequate hydration. She denies diarrhea, changes in urinary frequency or symptoms. No close sick contacts. Her main concern is lack of sleep due to non-productive cough.  Patient is a 44 y.o. female presenting with cough.  Cough   Past Medical History  Diagnosis Date  . Anxiety   . Depression    History reviewed. No pertinent past surgical history. No family history on file. History  Substance Use Topics  . Smoking status: Current Every Day Smoker  . Smokeless tobacco: Never Used  . Alcohol Use: Yes   OB History    No data available     Review of Systems  Respiratory: Positive for cough.   All other systems reviewed and are negative.     Allergies  Flexeril; Lactose intolerance (gi); and Penicillins  Home Medications   Prior to Admission medications   Medication Sig Start Date End Date Taking? Authorizing Provider  acetaminophen (TYLENOL) 500 MG tablet Take 1,000 mg by mouth every 6 (six) hours as needed for mild pain or headache.   Yes Historical Provider, MD  ALPRAZolam Prudy Feeler) 1 MG tablet Take 1 tablet (1 mg total) by mouth at bedtime as needed for sleep. 07/21/12  Yes Jennifer Piepenbrink, PA-C  FLUoxetine  (PROZAC) 20 MG tablet Take 1 tablet (20 mg total) by mouth daily. 07/21/12  Yes Jennifer Piepenbrink, PA-C  traMADol (ULTRAM) 50 MG tablet Take 50 mg by mouth every 6 (six) hours as needed for moderate pain.   Yes Historical Provider, MD  ibuprofen (ADVIL,MOTRIN) 800 MG tablet Take 1 tablet (800 mg total) by mouth 3 (three) times daily. 12/02/12   Hannah Muthersbaugh, PA-C   BP 116/75 mmHg  Pulse 90  Temp(Src) 100.7 F (38.2 C) (Oral)  Resp 20  SpO2 97% Physical Exam  Constitutional: She is oriented to person, place, and time. She appears well-developed and well-nourished.  HENT:  Head: Normocephalic and atraumatic.  Right Ear: Hearing, tympanic membrane, external ear and ear canal normal.  Left Ear: Hearing, tympanic membrane, external ear and ear canal normal.  Nose: Rhinorrhea present.  Mouth/Throat: Uvula is midline, oropharynx is clear and moist and mucous membranes are normal. No oropharyngeal exudate, posterior oropharyngeal edema, posterior oropharyngeal erythema or tonsillar abscesses.  Eyes: Pupils are equal, round, and reactive to light.  Neck: Normal range of motion. Neck supple. No JVD present. No tracheal deviation present. No thyromegaly present.  Cardiovascular: Normal rate, regular rhythm, normal heart sounds and intact distal pulses.  Exam reveals no gallop and no friction rub.   No murmur heard. Pulmonary/Chest: Effort normal. No stridor. No respiratory distress. She has no decreased breath sounds. She has wheezes in the right upper field. She has no rales. She exhibits  no tenderness.  Musculoskeletal: Normal range of motion.  Lymphadenopathy:    She has no cervical adenopathy.  Neurological: She is alert and oriented to person, place, and time. Coordination normal.  Skin: Skin is warm and dry.  Psychiatric: She has a normal mood and affect. Her behavior is normal. Judgment and thought content normal.  Nursing note and vitals reviewed.   ED Course  Procedures  (including critical care time) Labs Review Labs Reviewed  I-STAT CHEM 8, ED    Imaging Review Dg Chest 2 View  04/13/2014   CLINICAL DATA:  Chills, headache and cough for 2 days.  EXAM: CHEST  2 VIEW  COMPARISON:  None.  FINDINGS: The heart size and mediastinal contours are within normal limits. Both lungs are clear. The visualized skeletal structures are unremarkable.  IMPRESSION: No active cardiopulmonary disease.   Electronically Signed   By: Richarda OverlieAdam  Henn M.D.   On: 04/13/2014 18:16     EKG Interpretation None      MDM   Final diagnoses:  Respiratory infection    Labs: chem 8 sodium 134  Imaging: normal  Therapeutics: albuterol; improved depth of respirations, decreased wheez  Assessment/ Plan: Respiratory infection likely viral in nature. Pt presented with a fever, cough, and fatigue. Concern for a secondary bacterial infection on Dxx. Pt was discharged home with cough medication, antibiotics, albuterol, and anti-nausea medications. She was instructed to use them as directed and to follow-up with her PCP. If symptoms do not improve or new or worsening symptoms present she was encouraged to return for further evaluation. She understood and agreed to the plan.        Eyvonne MechanicJeffrey Chasey Dull, PA-C 04/13/14 2055  Rolan BuccoMelanie Belfi, MD 04/13/14 2255

## 2014-04-13 NOTE — Discharge Instructions (Signed)
Viral Infections A viral infection can be caused by different types of viruses.Most viral infections are not serious and resolve on their own. However, some infections may cause severe symptoms and may lead to further complications. SYMPTOMS Viruses can frequently cause:  Minor sore throat.  Aches and pains.  Headaches.  Runny nose.  Different types of rashes.  Watery eyes.  Tiredness.  Cough.  Loss of appetite.  Gastrointestinal infections, resulting in nausea, vomiting, and diarrhea. These symptoms do not respond to antibiotics because the infection is not caused by bacteria. However, you might catch a bacterial infection following the viral infection. This is sometimes called a "superinfection." Symptoms of such a bacterial infection may include:  Worsening sore throat with pus and difficulty swallowing.  Swollen neck glands.  Chills and a high or persistent fever.  Severe headache.  Tenderness over the sinuses.  Persistent overall ill feeling (malaise), muscle aches, and tiredness (fatigue).  Persistent cough.  Yellow, green, or brown mucus production with coughing. HOME CARE INSTRUCTIONS   Only take over-the-counter or prescription medicines for pain, discomfort, diarrhea, or fever as directed by your caregiver.  Drink enough water and fluids to keep your urine clear or pale yellow. Sports drinks can provide valuable electrolytes, sugars, and hydration.  Get plenty of rest and maintain proper nutrition. Soups and broths with crackers or rice are fine. SEEK IMMEDIATE MEDICAL CARE IF:   You have severe headaches, shortness of breath, chest pain, neck pain, or an unusual rash.  You have uncontrolled vomiting, diarrhea, or you are unable to keep down fluids.  You or your child has an oral temperature above 102 F (38.9 C), not controlled by medicine.  Your baby is older than 3 months with a rectal temperature of 102 F (38.9 C) or higher.  Your baby is 333  months old or younger with a rectal temperature of 100.4 F (38 C) or higher. MAKE SURE YOU:   Understand these instructions.  Will watch your condition.  Will get help right away if you are not doing well or get worse. Document Released: 10/31/2004 Document Revised: 04/15/2011 Document Reviewed: 05/28/2010 [facility] Patient Information 2015 [place], [place]. This information is not intended to replace advice given to you by your health care provider. Make sure you discuss any questions you have with your health care provider.  Pt is advised to monitor for worsening signs of infection. If symptoms do not improve or worsen please seek further medical care.

## 2014-04-13 NOTE — ED Notes (Addendum)
Pt c/o flu-like sxs, cough, chills, sweats, head pains since Monday. Pt also states she needs refills on her xanax.

## 2014-04-27 IMAGING — CR DG HIP COMPLETE 2+V*R*
3 series · 3 of 3 positions shown · non-contrast
Comparison: No priors.

CLINICAL DATA: Injury complaining of right hip pain.

RIGHT HIP - COMPLETE 2+ VIEW

[t pelvis ap]
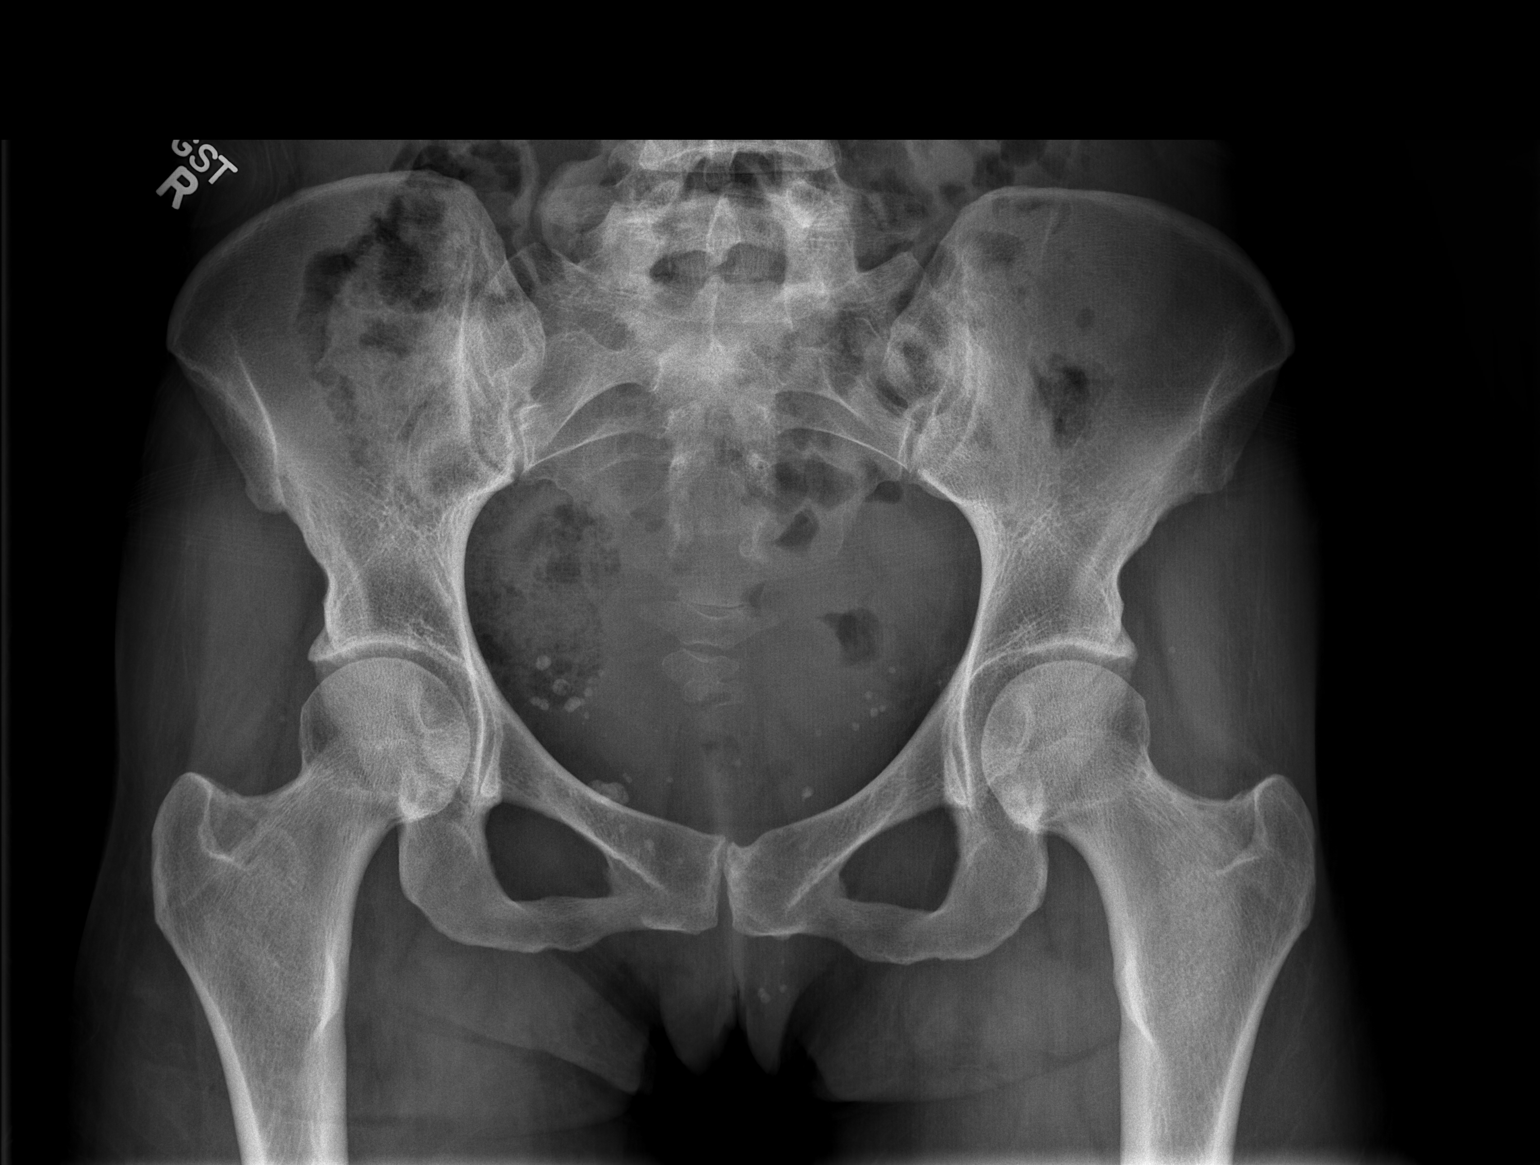

[t hip ap right]
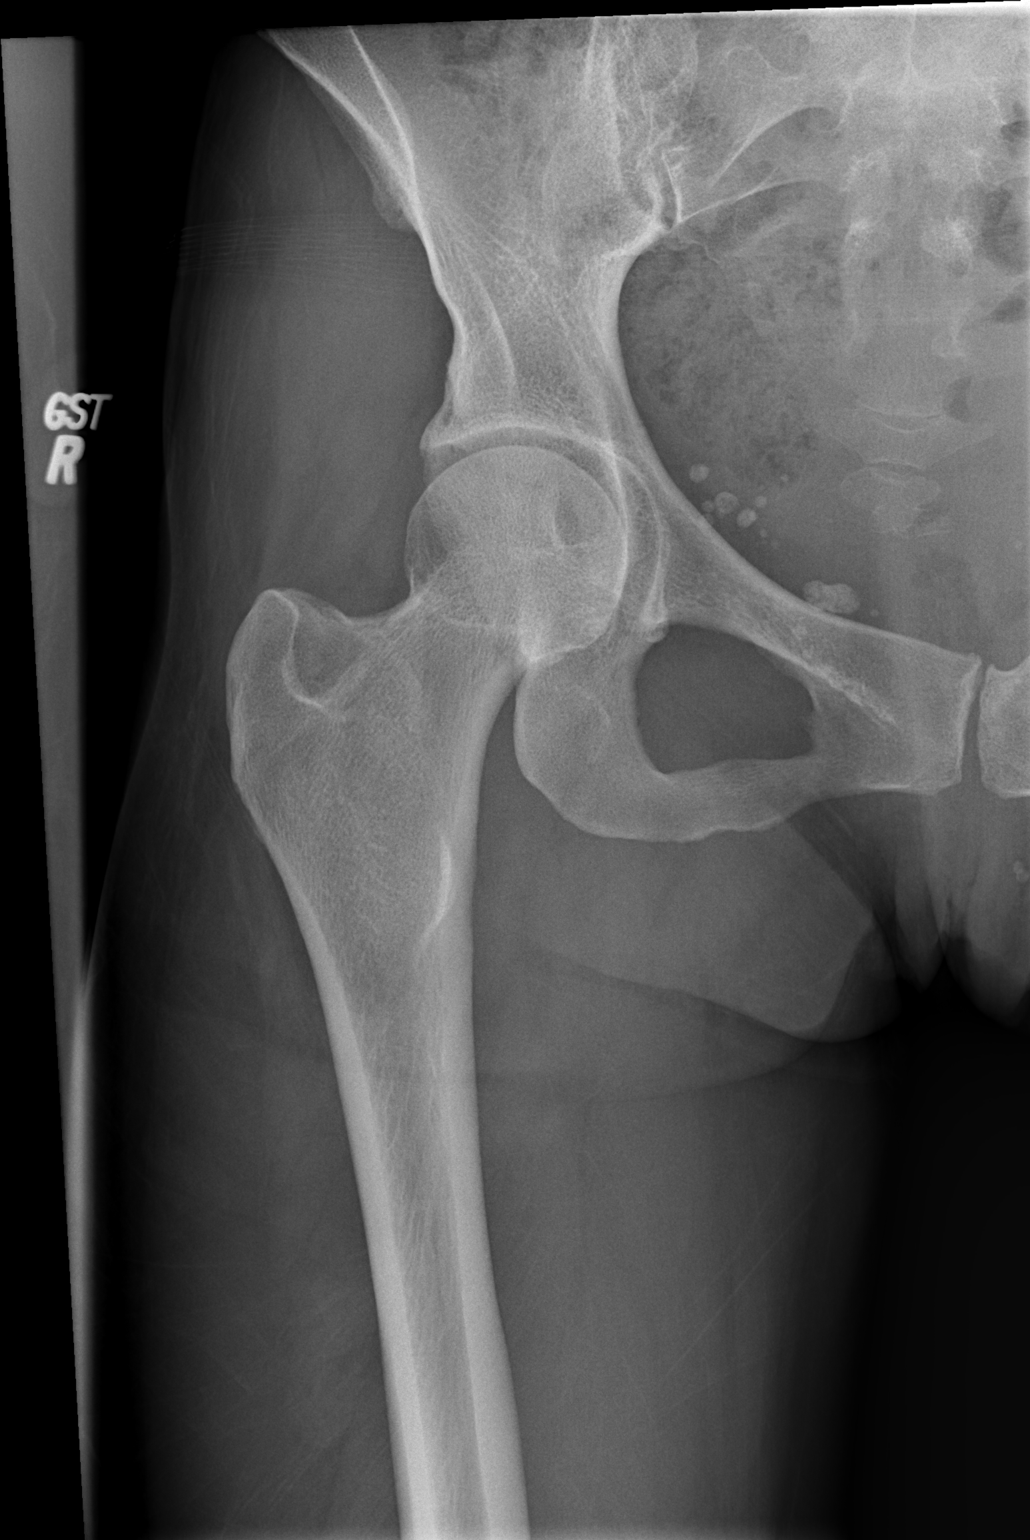

[t hip frog leg right]
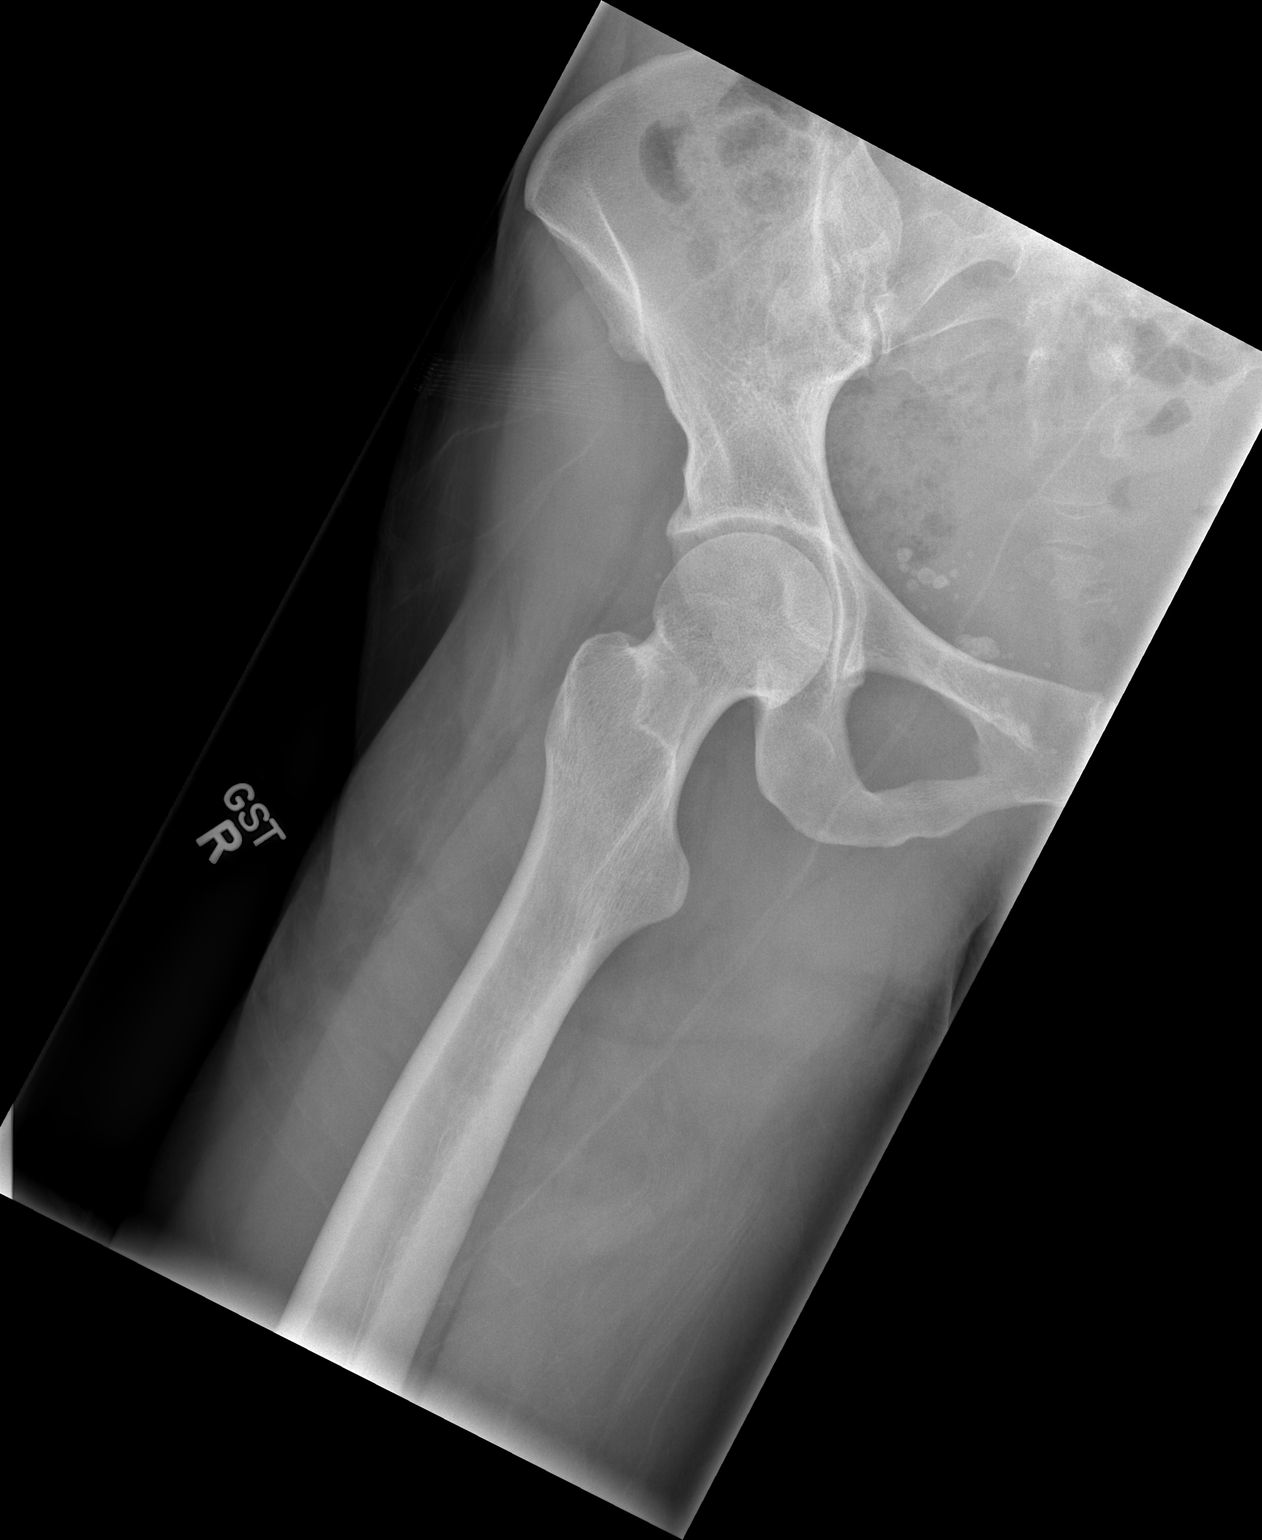

[3 of 3 positions shown; findings below may reference images not displayed]

FINDINGS: AP view of the pelvis and AP and lateral views of the
right hip demonstrate no definite acute displaced fracture of the
bony pelvis.  Bilateral proximal femurs as visualized appear
intact, and the right femoral head is properly located.  Numerous
pelvic phleboliths are incidentally noted.
IMPRESSION: 1.  No acute radiographic abnormality of the bony pelvis or right
hip.

## 2015-05-26 ENCOUNTER — Encounter (HOSPITAL_COMMUNITY): Payer: Self-pay

## 2015-05-26 ENCOUNTER — Emergency Department (HOSPITAL_COMMUNITY): Payer: Medicaid Other

## 2015-05-26 ENCOUNTER — Emergency Department (HOSPITAL_COMMUNITY)
Admission: EM | Admit: 2015-05-26 | Discharge: 2015-05-26 | Disposition: A | Discharge status: Home / Self Care | Payer: Medicaid Other | Attending: Emergency Medicine | Admitting: Emergency Medicine

## 2015-05-26 DIAGNOSIS — Z792 Long term (current) use of antibiotics: Secondary | ICD-10-CM | POA: Insufficient documentation

## 2015-05-26 DIAGNOSIS — Z79899 Other long term (current) drug therapy: Secondary | ICD-10-CM | POA: Diagnosis not present

## 2015-05-26 DIAGNOSIS — M545 Low back pain: Secondary | ICD-10-CM | POA: Diagnosis not present

## 2015-05-26 DIAGNOSIS — Z791 Long term (current) use of non-steroidal anti-inflammatories (NSAID): Secondary | ICD-10-CM | POA: Diagnosis not present

## 2015-05-26 DIAGNOSIS — F17210 Nicotine dependence, cigarettes, uncomplicated: Secondary | ICD-10-CM | POA: Diagnosis not present

## 2015-05-26 DIAGNOSIS — M5431 Sciatica, right side: Secondary | ICD-10-CM

## 2015-05-26 DIAGNOSIS — M79641 Pain in right hand: Secondary | ICD-10-CM | POA: Diagnosis not present

## 2015-05-26 DIAGNOSIS — F329 Major depressive disorder, single episode, unspecified: Secondary | ICD-10-CM | POA: Diagnosis not present

## 2015-05-26 HISTORY — DX: Dorsalgia, unspecified: M54.9

## 2015-05-26 HISTORY — DX: Other chronic pain: G89.29

## 2015-05-26 MED ORDER — OXYCODONE-ACETAMINOPHEN 5-325 MG PO TABS
1.0000 | ORAL_TABLET | Freq: Once | ORAL | Status: AC
  Administered 2015-05-26: 1 via ORAL
  Filled 2015-05-26: qty 1

## 2015-05-26 MED ORDER — KETOROLAC TROMETHAMINE 60 MG/2ML IM SOLN
60.0000 mg | Freq: Once | INTRAMUSCULAR | Status: DC
  Filled 2015-05-26: qty 2

## 2015-05-26 MED ORDER — DIAZEPAM 5 MG PO TABS
5.0000 mg | ORAL_TABLET | Freq: Once | ORAL | Status: AC
  Administered 2015-05-26: 5 mg via ORAL
  Filled 2015-05-26: qty 1

## 2015-05-26 MED ORDER — HYDROCODONE-ACETAMINOPHEN 5-325 MG PO TABS: 1.0000 | ORAL_TABLET | Freq: Four times a day (QID) | ORAL | Status: DC | PRN

## 2015-05-26 MED ORDER — DIAZEPAM 5 MG PO TABS: 5.0000 mg | ORAL_TABLET | Freq: Three times a day (TID) | ORAL | Status: DC | PRN

## 2015-05-26 MED ORDER — OXYCODONE-ACETAMINOPHEN 5-325 MG PO TABS
1.0000 | ORAL_TABLET | ORAL | Status: AC | PRN
  Administered 2015-05-26 (×2): 1 via ORAL
  Filled 2015-05-26 (×2): qty 1

## 2015-05-26 MED ORDER — PREDNISONE 50 MG PO TABS: 50.0000 mg | ORAL_TABLET | Freq: Every day | ORAL | Status: DC

## 2015-05-26 MED ORDER — PREDNISONE 20 MG PO TABS
60.0000 mg | ORAL_TABLET | Freq: Once | ORAL | Status: AC
  Administered 2015-05-26: 60 mg via ORAL
  Filled 2015-05-26: qty 3

## 2015-05-26 NOTE — ED Notes (Signed)
Pain medication given in Triage. Patient advised about side effects of medications and  to avoid driving for a minimum of 4 hours.  

## 2015-05-26 NOTE — ED Notes (Addendum)
Per EMS, Pt, from home/hotel, c/o increasing low back pain and spasms x 1 day and sharp Bilateral upper leg pain and R hand pain starting this evening.  Pain score 10/10.  Pt reports that she was moving furniture yesterday.  R hand pain began after hitting it on the Nurses' station.  Pt has not taken anything for pain.  Hx of anxiety and Pt is hyperventilating during assessment.

## 2015-05-26 NOTE — Discharge Instructions (Signed)
Return here as needed.  Follow-up with a primary care doctor.  Your x-rays did not show any abnormalities °

## 2015-05-26 NOTE — ED Provider Notes (Signed)
CSN: [id number]     Arrival date & time 05/26/15  1638 History   First MD Initiated Contact with Patient 05/26/15 1724     Chief Complaint  Patient presents with  . Back Pain  . Hand Pain    HPI The patient is a 45 year old black female PMH of OCD traits, depression, and panic attacks who presents with acute lower back pain. The patient was lifting heavy furniture yesterday with no complaints or issues at the time. Today midday she endorses shooting pain from her lower back down her legs, numbness and tingling down legs bilaterally. States weakness with difficulty ambulating, describes pain as 10/10 in lumbosacral area. Denies nausea, vomiting, headaches, fever, warmth to the area, urinary or bladder incontinence or focal neurological deficits.   Past Medical History  Diagnosis Date  . Anxiety   . Depression   . Chronic back pain    History reviewed. No pertinent past surgical history. History reviewed. No pertinent family history. Social History  Substance Use Topics  . Smoking status: Current Every Day Smoker -- 2.00 packs/day    Types: Cigarettes  . Smokeless tobacco: Never Used  . Alcohol Use: No   OB History    No data available     Review of Systems All pertinent ROS as above in HPI   Allergies  Flexeril; Lactose intolerance (gi); and Penicillins  Home Medications   Prior to Admission medications   Medication Sig Start Date End Date Taking? Authorizing Provider  ALPRAZolam Prudy Feeler(XANAX) 1 MG tablet Take 1 tablet (1 mg total) by mouth at bedtime as needed for sleep. 07/21/12  Yes Jennifer Piepenbrink, PA-C  oxyCODONE-acetaminophen (PERCOCET) 10-325 MG tablet Take 1 tablet by mouth every 6 (six) hours as needed for pain.   Yes Historical Provider, MD  azithromycin (ZITHROMAX) 250 MG tablet Take 1 tablet (250 mg total) by mouth daily. Take one tablet daily for the next 4 days Patient not taking: Reported on 05/26/2015 04/13/14   Eyvonne MechanicJeffrey Hedges, PA-C  benzonatate (TESSALON) 100  MG capsule Take 1 capsule (100 mg total) by mouth every 8 (eight) hours. Patient not taking: Reported on 05/26/2015 04/13/14   Eyvonne MechanicJeffrey Hedges, PA-C  FLUoxetine (PROZAC) 20 MG tablet Take 1 tablet (20 mg total) by mouth daily. Patient not taking: Reported on 05/26/2015 07/21/12   Francee PiccoloJennifer Piepenbrink, PA-C  ibuprofen (ADVIL,MOTRIN) 800 MG tablet Take 1 tablet (800 mg total) by mouth 3 (three) times daily. Patient not taking: Reported on 05/26/2015 12/02/12   Dahlia ClientHannah Muthersbaugh, PA-C  ondansetron (ZOFRAN) 4 MG tablet Take 1 tablet (4 mg total) by mouth every 8 (eight) hours as needed for nausea or vomiting. Patient not taking: Reported on 05/26/2015 04/13/14   Marlon Peliffany Greene, PA-C   BP 114/70 mmHg  Pulse 85  Temp(Src) 98.2 F (36.8 C) (Oral)  Resp 22  SpO2 100%  LMP 07/20/2012 Physical Exam  Constitutional: She is oriented to person, place, and time. She appears well-developed and well-nourished. She appears distressed.  Patient crying on exam, slow motions due to intolerable pain  HENT:  Head: Normocephalic and atraumatic.  Mouth/Throat: Oropharynx is clear and moist.  Eyes: Pupils are equal, round, and reactive to light.  Neck: Normal range of motion. Neck supple.  Cardiovascular: Normal rate, regular rhythm, normal heart sounds and intact distal pulses.  Exam reveals no gallop and no friction rub.   No murmur heard. Pulmonary/Chest: Effort normal and breath sounds normal. No respiratory distress. She has no wheezes. She has no rales.  Musculoskeletal: She exhibits no edema.  Limited ROM due to pain. Positive straight leg raise to 10 degrees flexion. Pain with lumbar flexion and extension.  Patient unable to perform balance on one leg due to inability to raise legs due to pain.   Neurological: She is alert and oriented to person, place, and time. She has normal reflexes. No cranial nerve deficit. She exhibits normal muscle tone. Coordination normal.  Sensation and motor control intact in  bilateral lower extremities  Skin: Skin is warm and dry. No rash noted. No erythema.  Nursing note and vitals reviewed.   ED Course  Procedures (including critical care time) Labs Review Labs Reviewed - No data to display  Imaging Review Dg Hand Complete Right  05/26/2015  CLINICAL DATA:  Right hand crushing injury today between wheelchair and door. Pain, swelling, and abrasion of right hand. Initial encounter. EXAM: RIGHT HAND - COMPLETE 3+ VIEW COMPARISON:  None. FINDINGS: There is no evidence of fracture or dislocation. There is no evidence of arthropathy or other focal bone abnormality. Soft tissues are unremarkable. IMPRESSION: Negative. Electronically Signed   By: Myles Rosenthal M.D.   On: 05/26/2015 17:26   I have personally reviewed and evaluated these images and lab results as part of my medical decision-making.   MDM   The patient presents with acute lower back pain, medication ordered for pain control. Hx telling for possible disc herniation with radiculopathy vs lumbosacral MSK strain. Patient seems to respond well to pain medications, ordered Xray to r/o acute fractures or DJD causing acute pain at this time. Xray of lumbar spine shows no acute bone abnormalities, no fractures noted. Will send patient home on anti-inflammatories and muscle relaxants for pain and spasms. Patient does not have any neurological deficits and can walk without difficulty.  Patient is advised to follow-up with her primary care doctor   Charlestine Night, PA-C 05/27/15 1610  Lyndal Pulley, MD 05/28/15 [phone]

## 2015-08-30 ENCOUNTER — Emergency Department (HOSPITAL_COMMUNITY): Payer: Medicaid Other

## 2015-08-30 ENCOUNTER — Encounter (HOSPITAL_COMMUNITY): Payer: Self-pay

## 2015-08-30 ENCOUNTER — Emergency Department (HOSPITAL_COMMUNITY)
Admission: EM | Admit: 2015-08-30 | Discharge: 2015-08-30 | Disposition: A | Discharge status: Home / Self Care | Payer: Medicaid Other | Attending: Emergency Medicine | Admitting: Emergency Medicine

## 2015-08-30 DIAGNOSIS — W19XXXA Unspecified fall, initial encounter: Secondary | ICD-10-CM

## 2015-08-30 DIAGNOSIS — Y939 Activity, unspecified: Secondary | ICD-10-CM | POA: Insufficient documentation

## 2015-08-30 DIAGNOSIS — M7660 Achilles tendinitis, unspecified leg: Secondary | ICD-10-CM | POA: Insufficient documentation

## 2015-08-30 DIAGNOSIS — T63461A Toxic effect of venom of wasps, accidental (unintentional), initial encounter: Secondary | ICD-10-CM

## 2015-08-30 DIAGNOSIS — M25571 Pain in right ankle and joints of right foot: Secondary | ICD-10-CM | POA: Diagnosis not present

## 2015-08-30 DIAGNOSIS — Y999 Unspecified external cause status: Secondary | ICD-10-CM | POA: Insufficient documentation

## 2015-08-30 DIAGNOSIS — X58XXXA Exposure to other specified factors, initial encounter: Secondary | ICD-10-CM | POA: Diagnosis not present

## 2015-08-30 DIAGNOSIS — F17210 Nicotine dependence, cigarettes, uncomplicated: Secondary | ICD-10-CM | POA: Insufficient documentation

## 2015-08-30 DIAGNOSIS — Y929 Unspecified place or not applicable: Secondary | ICD-10-CM | POA: Diagnosis not present

## 2015-08-30 MED ORDER — OXYCODONE-ACETAMINOPHEN 5-325 MG PO TABS
1.0000 | ORAL_TABLET | Freq: Once | ORAL | Status: AC
  Administered 2015-08-30: 1 via ORAL
  Filled 2015-08-30: qty 1

## 2015-08-30 MED ORDER — IBUPROFEN 200 MG PO TABS
600.0000 mg | ORAL_TABLET | Freq: Once | ORAL | Status: AC
  Administered 2015-08-30: 600 mg via ORAL
  Filled 2015-08-30: qty 1

## 2015-08-30 MED ORDER — PREDNISONE 20 MG PO TABS
60.0000 mg | ORAL_TABLET | Freq: Once | ORAL | Status: AC
  Administered 2015-08-30: 60 mg via ORAL
  Filled 2015-08-30: qty 3

## 2015-08-30 MED ORDER — CETIRIZINE HCL 10 MG PO TABS: 10.0000 mg | ORAL_TABLET | Freq: Every day | ORAL | 0 refills | Status: DC

## 2015-08-30 MED ORDER — HYDROCODONE-ACETAMINOPHEN 5-325 MG PO TABS: 2.0000 | ORAL_TABLET | ORAL | 0 refills | Status: DC | PRN

## 2015-08-30 MED ORDER — DIPHENHYDRAMINE HCL 25 MG PO CAPS
50.0000 mg | ORAL_CAPSULE | Freq: Once | ORAL | Status: AC
  Administered 2015-08-30: 50 mg via ORAL
  Filled 2015-08-30: qty 2

## 2015-08-30 MED ORDER — FAMOTIDINE 20 MG PO TABS
40.0000 mg | ORAL_TABLET | Freq: Once | ORAL | Status: AC
  Administered 2015-08-30: 40 mg via ORAL
  Filled 2015-08-30: qty 2

## 2015-08-30 NOTE — ED Triage Notes (Signed)
Patient here with right foot pain and burning after stepping on yellow jacket this evening. Complains of pain to same and pain with ambulation

## 2015-08-30 NOTE — ED Provider Notes (Signed)
[facility]-EMERGENCY DEPT Provider Note   CSN: [id number] Arrival date & time: 08/30/15  1843  First Provider Contact: 9:13   By signing my name below, I, Vista Mink, attest that this documentation has been prepared under the direction and in the presence of [employer].  Electronically Signed: Vista Mink, ED Scribe. 08/30/15. 9:29 PM.   History   Chief Complaint No chief complaint on file.   HPI HPI Comments: Briana Harris is a 45 y.o. female who presents to the Emergency Department complaining of a yellow jacket sting to the medial aspect of the bottom of her foot; that occurred this afternoon. Pt reports a yellow jacket stung her on the bottom of her right foot and then twisted her right ankle and fell on the ground. Pt complains of constant pain and gradually worsening to the area of the sting. Pt further complains of an intermittent shooting pain that radiates up her calf. Pt states she has had decreased range of motion in the ankle since the incident occurred. Pt reports difficulty ambulating due to the pain and currently notes mild numbness to the toes on her right foot. Pt denies Hx of similar symptoms. Pt denies any further injury.    The history is provided by the patient. No language interpreter was used.    Past Medical History:  Diagnosis Date  . Anxiety   . Chronic back pain   . Depression     There are no active problems to display for this patient.   History reviewed. No pertinent surgical history.  OB History    No data available       Home Medications    Prior to Admission medications   Medication Sig Start Date End Date Taking? Authorizing Provider  ALPRAZolam Prudy Feeler) 1 MG tablet Take 1 tablet (1 mg total) by mouth at bedtime as needed for sleep. 07/21/12   Jennifer Piepenbrink, PA-C  azithromycin (ZITHROMAX) 250 MG tablet Take 1 tablet (250 mg total) by mouth daily. Take one tablet daily for the next 4 days Patient not taking: Reported on 05/26/2015  04/13/14   Eyvonne Mechanic, PA-C  benzonatate (TESSALON) 100 MG capsule Take 1 capsule (100 mg total) by mouth every 8 (eight) hours. Patient not taking: Reported on 05/26/2015 04/13/14   Eyvonne Mechanic, PA-C  diazepam (VALIUM) 5 MG tablet Take 1 tablet (5 mg total) by mouth every 8 (eight) hours as needed for muscle spasms. 05/26/15   Charlestine Night, PA-C  FLUoxetine (PROZAC) 20 MG tablet Take 1 tablet (20 mg total) by mouth daily. Patient not taking: Reported on 05/26/2015 07/21/12   Francee Piccolo, PA-C  HYDROcodone-acetaminophen (NORCO/VICODIN) 5-325 MG tablet Take 1 tablet by mouth every 6 (six) hours as needed for moderate pain. 05/26/15   Charlestine Night, PA-C  ibuprofen (ADVIL,MOTRIN) 800 MG tablet Take 1 tablet (800 mg total) by mouth 3 (three) times daily. Patient not taking: Reported on 05/26/2015 12/02/12   Dahlia Client Muthersbaugh, PA-C  ondansetron (ZOFRAN) 4 MG tablet Take 1 tablet (4 mg total) by mouth every 8 (eight) hours as needed for nausea or vomiting. Patient not taking: Reported on 05/26/2015 04/13/14   Marlon Pel, PA-C  oxyCODONE-acetaminophen (PERCOCET) 10-325 MG tablet Take 1 tablet by mouth every 6 (six) hours as needed for pain.    Historical Provider, MD  predniSONE (DELTASONE) 50 MG tablet Take 1 tablet (50 mg total) by mouth daily with breakfast. 05/26/15   Charlestine Night, PA-C    Family History No family history on  file.  Social History Social History  Substance Use Topics  . Smoking status: Current Every Day Smoker    Packs/day: 2.00    Types: Cigarettes  . Smokeless tobacco: Never Used  . Alcohol use No     Allergies   Flexeril [cyclobenzaprine]; Lactose intolerance (gi); and Penicillins   Review of Systems Review of Systems  Musculoskeletal: Positive for arthralgias (right foot and ankle), gait problem (due to pain) and joint swelling (right ankle).  Neurological: Positive for numbness (toes).  All other systems reviewed and are  negative.    Physical Exam Updated Vital Signs BP (!) 139/103 (BP Location: Left Arm)   Pulse 79   Temp 98.5 F (36.9 C) (Oral)   Resp 18   LMP 07/20/2012   SpO2 99%   Physical Exam  Constitutional: She is oriented to person, place, and time. She appears well-developed and well-nourished. No distress.  HENT:  Head: Normocephalic and atraumatic.  Neck: Normal range of motion.  Pulmonary/Chest: Effort normal.  Musculoskeletal:  Right ankle: No obvious swelling. There is a deformity of the Achilles tendon with tenderness to palpation. Does not seem to be ruptured completely. ROM of ankle is markedly decreased. N/V intact.   Neurological: She is alert and oriented to person, place, and time.  Skin: Skin is warm and dry. She is not diaphoretic.  Area of swelling with tenderness to palpation on medial and plantar aspect of the right foot. No stinger visualized.  Psychiatric: She has a normal mood and affect. Judgment normal.  Nursing note and vitals reviewed.    ED Treatments / Results    Radiology Dg Ankle Complete Right  Result Date: 08/30/2015 CLINICAL DATA:  45 year old female with right ankle pain. EXAM: RIGHT ANKLE - COMPLETE 3+ VIEW COMPARISON:  None. FINDINGS: There is no acute fracture or dislocation. The bones are well mineralized. The ankle mortise is intact. The soft tissues appear unremarkable. No radiopaque foreign object identified. IMPRESSION: Negative. Electronically Signed   By: Elgie Collard M.D.   On: 08/30/2015 21:57   Procedures Procedures  DIAGNOSTIC STUDIES: Oxygen Saturation is 99% on RA, normal by my interpretation.  COORDINATION OF CARE: 9:25 PM-Will consult with attending. Discussed treatment plan with pt at bedside and pt agreed to plan.    Medications Ordered in ED Medications  diphenhydrAMINE (BENADRYL) capsule 50 mg (50 mg Oral Given 08/30/15 2112)  predniSONE (DELTASONE) tablet 60 mg (60 mg Oral Given 08/30/15 2137)  famotidine (PEPCID)  tablet 40 mg (40 mg Oral Given 08/30/15 2137)  oxyCODONE-acetaminophen (PERCOCET/ROXICET) 5-325 MG per tablet 1 tablet (1 tablet Oral Given 08/30/15 2136)     Initial Impression / Assessment and Plan / ED Course  I have reviewed the triage vital signs and the nursing notes.  Pertinent labs & imaging results that were available during my care of the patient were reviewed by me and considered in my medical decision making (see chart for details).  Clinical Course   45 year old female who presents with wasp sting and a fall. Xray negative for bony pathology although exam is concerning for partial Achilles tendon rupture. Patient treated for allergic reaction and for pain. Posterior splint applied, crutches given, and patient was given Ortho follow up. Shared visit with Dr. Bebe Shaggy. Patient is NAD, non-toxic, with stable VS. Patient is informed of clinical course, understands medical decision making process, and agrees with plan. Opportunity for questions provided and all questions answered. Return precautions given.  I personally performed the services described in  this documentation, which was scribed in my presence. The recorded information has been reviewed and is accurate.  Final Clinical Impressions(s) / ED Diagnoses   Final diagnoses:  Wasp sting, accidental or unintentional, initial encounter  Fall, initial encounter  Achilles tendon pain    New Prescriptions Discharge Medication List as of 08/30/2015 11:25 PM    START taking these medications   Details  cetirizine (ZYRTEC) 10 MG tablet Take 1 tablet (10 mg total) by mouth daily., Starting Wed 08/30/2015, Print          Bethel Born, PA-C 08/30/15 2357    Zadie Rhine, MD 08/31/15 [phone]

## 2015-08-30 NOTE — ED Notes (Signed)
Ortho at bedside.

## 2015-08-30 NOTE — Progress Notes (Signed)
Orthopedic Tech Progress Note Patient Details:  Briana Harris 10/23/1970 [id number]  Ortho Devices Type of Ortho Device: Ace wrap, Crutches, Post (short leg) splint   Saul Fordyce 08/30/2015, 10:34 PM

## 2017-07-13 ENCOUNTER — Emergency Department (HOSPITAL_COMMUNITY): Payer: Medicaid Other

## 2017-07-13 ENCOUNTER — Encounter (HOSPITAL_COMMUNITY): Payer: Self-pay | Admitting: Emergency Medicine

## 2017-07-13 ENCOUNTER — Emergency Department (HOSPITAL_COMMUNITY)
Admission: EM | Admit: 2017-07-13 | Discharge: 2017-07-13 | Disposition: A | Discharge status: Home / Self Care | Payer: Medicaid Other | Attending: Emergency Medicine | Admitting: Emergency Medicine

## 2017-07-13 DIAGNOSIS — Y999 Unspecified external cause status: Secondary | ICD-10-CM | POA: Insufficient documentation

## 2017-07-13 DIAGNOSIS — S0003XA Contusion of scalp, initial encounter: Secondary | ICD-10-CM | POA: Insufficient documentation

## 2017-07-13 DIAGNOSIS — S0990XA Unspecified injury of head, initial encounter: Secondary | ICD-10-CM | POA: Diagnosis present

## 2017-07-13 DIAGNOSIS — F17210 Nicotine dependence, cigarettes, uncomplicated: Secondary | ICD-10-CM | POA: Insufficient documentation

## 2017-07-13 DIAGNOSIS — Y929 Unspecified place or not applicable: Secondary | ICD-10-CM | POA: Insufficient documentation

## 2017-07-13 DIAGNOSIS — Z79899 Other long term (current) drug therapy: Secondary | ICD-10-CM | POA: Diagnosis not present

## 2017-07-13 DIAGNOSIS — Y939 Activity, unspecified: Secondary | ICD-10-CM | POA: Insufficient documentation

## 2017-07-13 LAB — ETHANOL: Alcohol, Ethyl (B): 139 mg/dL — ABNORMAL HIGH (ref <10)

## 2017-07-13 MED ORDER — ACETAMINOPHEN 325 MG PO TABS
650.0000 mg | ORAL_TABLET | Freq: Once | ORAL | Status: AC
  Administered 2017-07-13: 650 mg via ORAL
  Filled 2017-07-13: qty 2

## 2017-07-13 MED ORDER — NAPROXEN 500 MG PO TABS: 500.0000 mg | ORAL_TABLET | Freq: Two times a day (BID) | ORAL | 0 refills | Status: DC

## 2017-07-13 MED ORDER — OXYCODONE-ACETAMINOPHEN 5-325 MG PO TABS
1.0000 | ORAL_TABLET | Freq: Once | ORAL | Status: AC
  Administered 2017-07-13: 1 via ORAL
  Filled 2017-07-13: qty 1

## 2017-07-13 NOTE — ED Notes (Signed)
  Patient was ambulated to the restroom but needed a lot of assistance.  Patient has tolerated PO fluids.

## 2017-07-13 NOTE — ED Provider Notes (Signed)
1:30 PM patient is alert Glasgow Coma Score 15 ambulates with slight limp favoring right leg.  States she suffers from chronic sciatica.  She will be given crutches   Doug SouJacubowitz, Taeya Theall, MD 07/13/17 1328
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
## 2017-07-13 NOTE — ED Notes (Signed)
Pt ambulated gingerly, but with steady gait. During ambulation, pt covered eyes (too bright) and occasionally held on to the rail. Pt stated that she felt "woozy" standing up. Pt complained of jaw pain. Informed Thayer Ohmhris - RN.
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
## 2017-07-13 NOTE — ED Notes (Signed)
Patient given discharge instructions including use of crutches and verbalized understanding.  Patient stable to discharge at this time.  Patient is alert and oriented to baseline.  No distressed noted at this time.  All belongings taken with the patient at discharge.
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
## 2017-07-13 NOTE — ED Notes (Signed)
Pt ambulated slowly in hallway with no issues.

## 2017-07-13 NOTE — ED Notes (Signed)
Pt taken to CT.

## 2019-02-27 ENCOUNTER — Other Ambulatory Visit: Payer: Self-pay

## 2019-02-27 ENCOUNTER — Encounter (HOSPITAL_COMMUNITY): Payer: Self-pay | Admitting: Emergency Medicine

## 2019-02-27 ENCOUNTER — Emergency Department (HOSPITAL_COMMUNITY): Payer: Medicaid Other

## 2019-02-27 ENCOUNTER — Emergency Department (HOSPITAL_COMMUNITY)
Admission: EM | Admit: 2019-02-27 | Discharge: 2019-02-27 | Disposition: A | Discharge status: Home / Self Care | Payer: Medicaid Other | Attending: Emergency Medicine | Admitting: Emergency Medicine

## 2019-02-27 DIAGNOSIS — Y929 Unspecified place or not applicable: Secondary | ICD-10-CM | POA: Insufficient documentation

## 2019-02-27 DIAGNOSIS — Y999 Unspecified external cause status: Secondary | ICD-10-CM | POA: Insufficient documentation

## 2019-02-27 DIAGNOSIS — M545 Low back pain: Secondary | ICD-10-CM | POA: Diagnosis not present

## 2019-02-27 DIAGNOSIS — S0990XA Unspecified injury of head, initial encounter: Secondary | ICD-10-CM

## 2019-02-27 DIAGNOSIS — F17210 Nicotine dependence, cigarettes, uncomplicated: Secondary | ICD-10-CM | POA: Diagnosis not present

## 2019-02-27 DIAGNOSIS — Z23 Encounter for immunization: Secondary | ICD-10-CM | POA: Diagnosis not present

## 2019-02-27 DIAGNOSIS — S50311A Abrasion of right elbow, initial encounter: Secondary | ICD-10-CM | POA: Diagnosis not present

## 2019-02-27 DIAGNOSIS — M549 Dorsalgia, unspecified: Secondary | ICD-10-CM

## 2019-02-27 DIAGNOSIS — R110 Nausea: Secondary | ICD-10-CM | POA: Diagnosis not present

## 2019-02-27 DIAGNOSIS — S299XXA Unspecified injury of thorax, initial encounter: Secondary | ICD-10-CM | POA: Insufficient documentation

## 2019-02-27 DIAGNOSIS — S3992XA Unspecified injury of lower back, initial encounter: Secondary | ICD-10-CM | POA: Insufficient documentation

## 2019-02-27 DIAGNOSIS — S5001XA Contusion of right elbow, initial encounter: Secondary | ICD-10-CM | POA: Diagnosis not present

## 2019-02-27 DIAGNOSIS — W19XXXA Unspecified fall, initial encounter: Secondary | ICD-10-CM

## 2019-02-27 DIAGNOSIS — Y9389 Activity, other specified: Secondary | ICD-10-CM | POA: Insufficient documentation

## 2019-02-27 DIAGNOSIS — M546 Pain in thoracic spine: Secondary | ICD-10-CM | POA: Insufficient documentation

## 2019-02-27 DIAGNOSIS — S59901A Unspecified injury of right elbow, initial encounter: Secondary | ICD-10-CM | POA: Insufficient documentation

## 2019-02-27 MED ORDER — IBUPROFEN 800 MG PO TABS
800.0000 mg | ORAL_TABLET | Freq: Once | ORAL | Status: AC
  Administered 2019-02-27: 06:00:00 800 mg via ORAL
  Filled 2019-02-27: qty 1

## 2019-02-27 MED ORDER — HYDROCODONE-ACETAMINOPHEN 5-325 MG PO TABS
2.0000 | ORAL_TABLET | Freq: Once | ORAL | Status: AC
  Administered 2019-02-27: 05:00:00 2 via ORAL
  Filled 2019-02-27: qty 2

## 2019-02-27 MED ORDER — ONDANSETRON 4 MG PO TBDP
4.0000 mg | ORAL_TABLET | Freq: Once | ORAL | Status: AC
  Administered 2019-02-27: 4 mg via ORAL
  Filled 2019-02-27: qty 1

## 2019-02-27 MED ORDER — IBUPROFEN 800 MG PO TABS: 800.0000 mg | ORAL_TABLET | Freq: Three times a day (TID) | ORAL | 0 refills | Status: DC | PRN

## 2019-02-27 MED ORDER — TETANUS-DIPHTH-ACELL PERTUSSIS 5-2.5-18.5 LF-MCG/0.5 IM SUSP
0.5000 mL | Freq: Once | INTRAMUSCULAR | Status: AC
  Administered 2019-02-27: 05:00:00 0.5 mL via INTRAMUSCULAR
  Filled 2019-02-27: qty 0.5

## 2019-02-27 MED ORDER — HYDROCODONE-ACETAMINOPHEN 5-325 MG PO TABS: 2.0000 | ORAL_TABLET | Freq: Four times a day (QID) | ORAL | 0 refills | Status: DC | PRN

## 2019-02-27 MED ORDER — ONDANSETRON 4 MG PO TBDP: 4.0000 mg | ORAL_TABLET | Freq: Four times a day (QID) | ORAL | 0 refills | Status: DC | PRN

## 2019-02-27 NOTE — ED Notes (Signed)
Discharge instructions discussed with pt. Pt verbalized understanding. Pt stable and ambulatory. No signature pad available. 
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
## 2019-02-27 NOTE — ED Triage Notes (Signed)
Patient fell and hit her head against the pavement while playing a hoover board this evening , denies LOC , reports headache , posterior neck pain , right elbow pain and low back pain .

## 2019-02-27 NOTE — ED Notes (Signed)
Pt ambulating well. Pt reports soreness in her sacral region. Pt reports that's where she landed when she fell.

## 2020-04-17 DIAGNOSIS — Z1152 Encounter for screening for COVID-19: Secondary | ICD-10-CM | POA: Diagnosis not present

## 2020-12-04 IMAGING — CT CT CERVICAL SPINE W/O CM
3 of 4 series · 13 of 33 positions shown, 16 images · non-contrast
Comparison: None.

CLINICAL DATA: Fall

EXAM:
CT HEAD WITHOUT CONTRAST
CT CERVICAL SPINE WITHOUT CONTRAST
TECHNIQUE: Multidetector CT imaging of the head and cervical spine was
performed following the standard protocol without intravenous
contrast. Multiplanar CT image reconstructions of the cervical spine
were also generated.

[Series 12: sag bone · sagittal · 0.43mm/px · 5 of 69 slices shown, 6 images]
[im 23/69  bone]
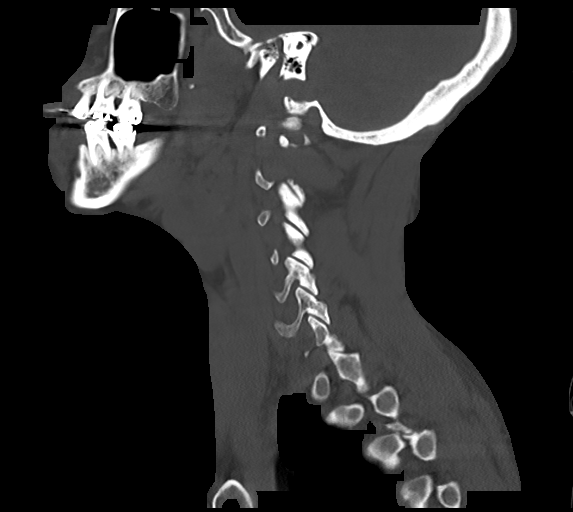
[im 29/69  bone]
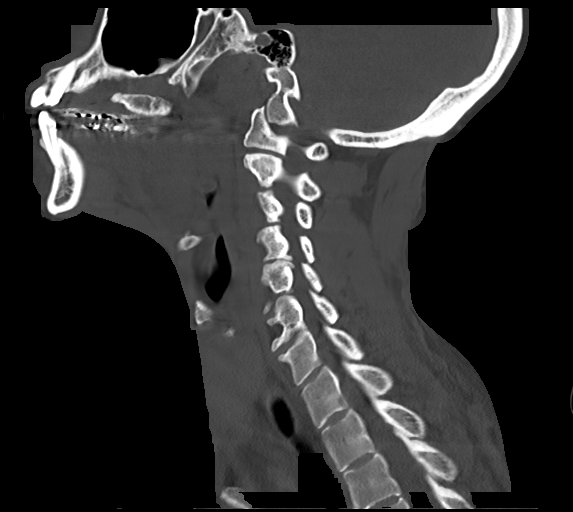
[im 35/69  soft-tissue]
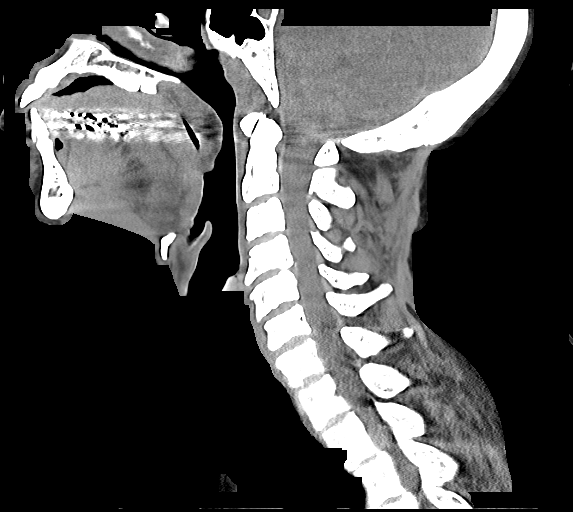
[im 35/69  bone]
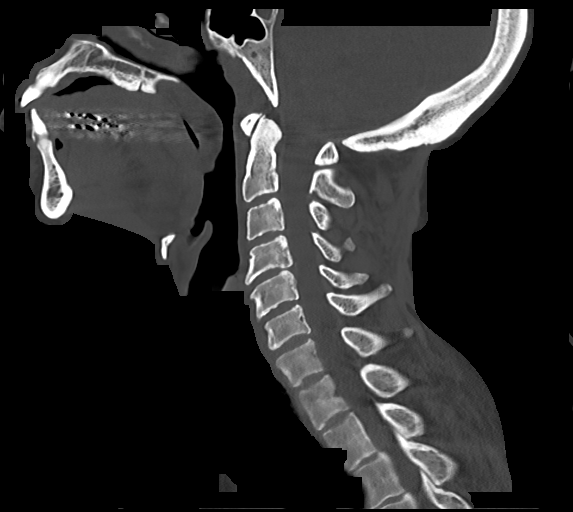
[im 40/69  bone]
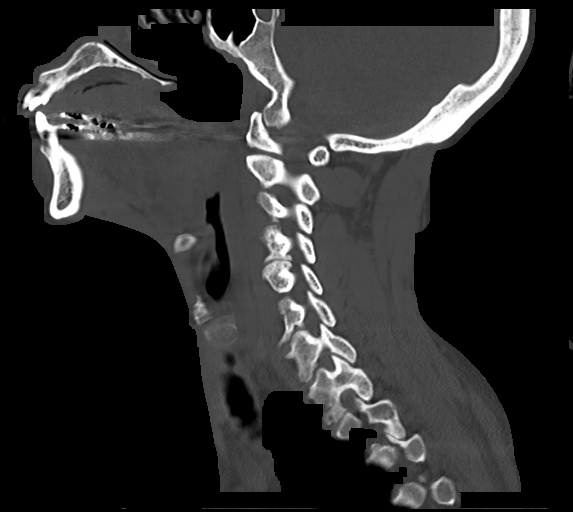
[im 46/69  bone]
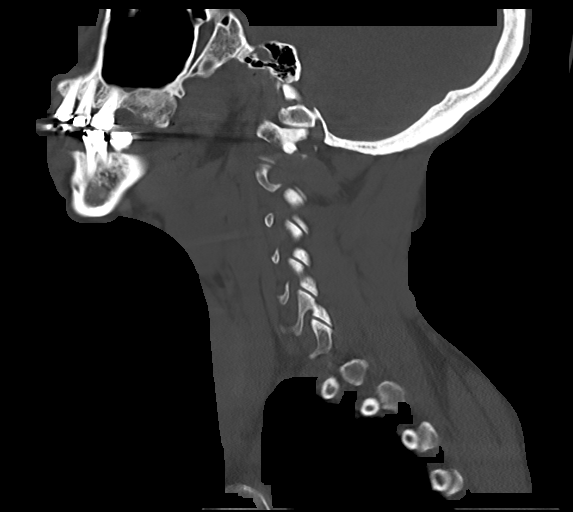

[Series 13: cor bone · coronal · 0.43mm/px · 3 of 94 slices shown]
[im 19/94  bone]
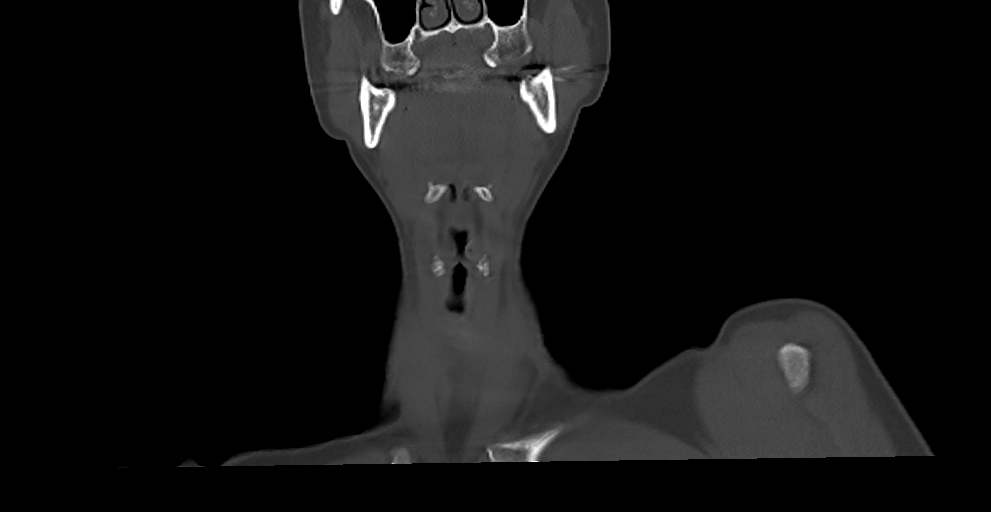
[im 38/94  bone]
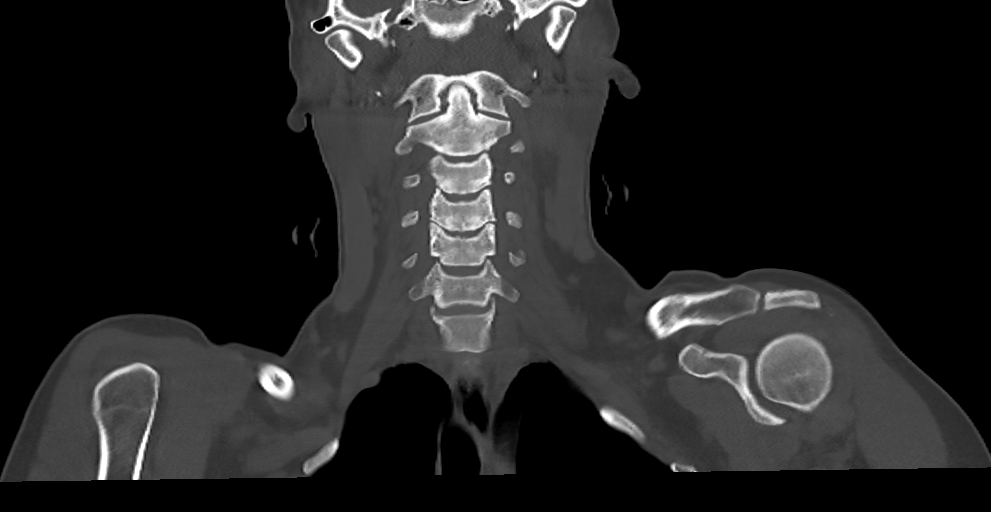
[im 56/94  bone]
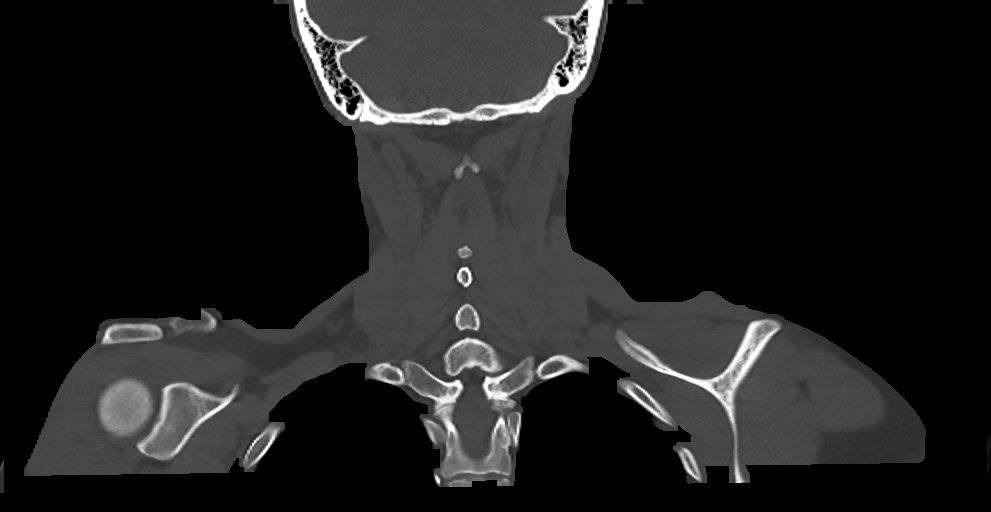

[Series 14: orthogonal axials · axial · 0.21mm/px · z∈[+1003,+1124]mm · 5 of 101 slices shown, 7 images]
[im 17/101  soft-tissue]
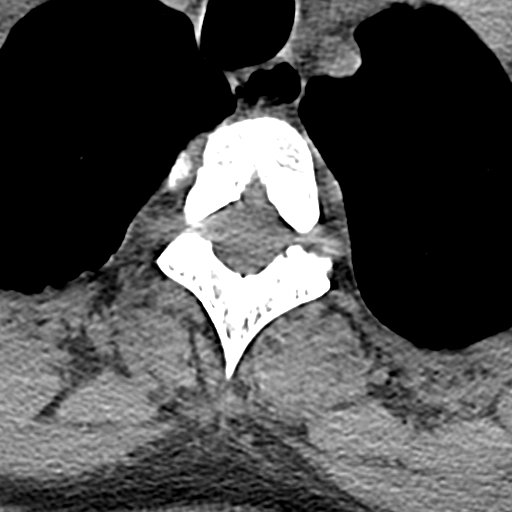
[im 17/101  bone]
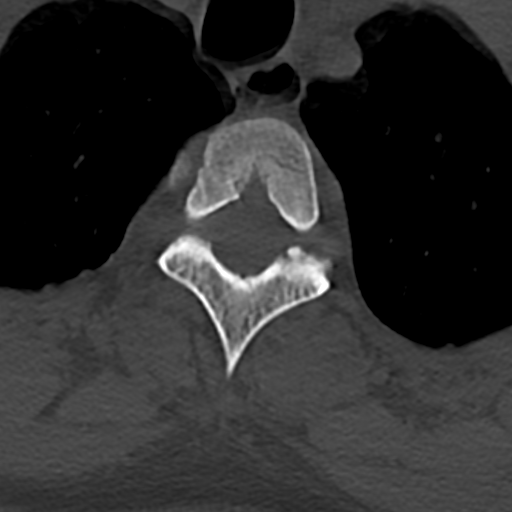
[im 34/101  bone]
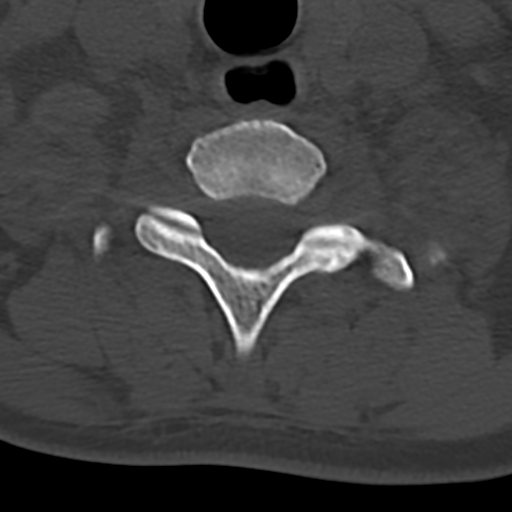
[im 51/101  bone]
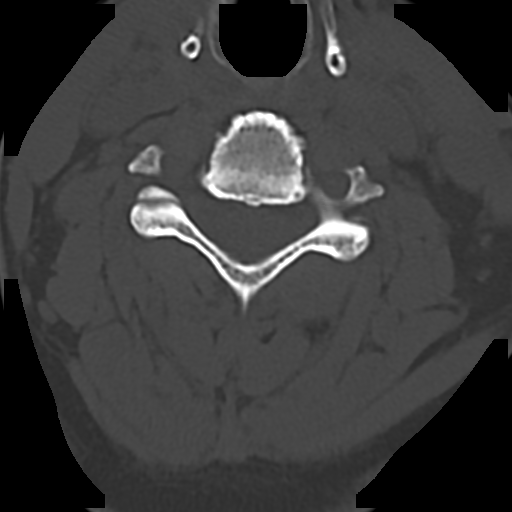
[im 67/101  bone]
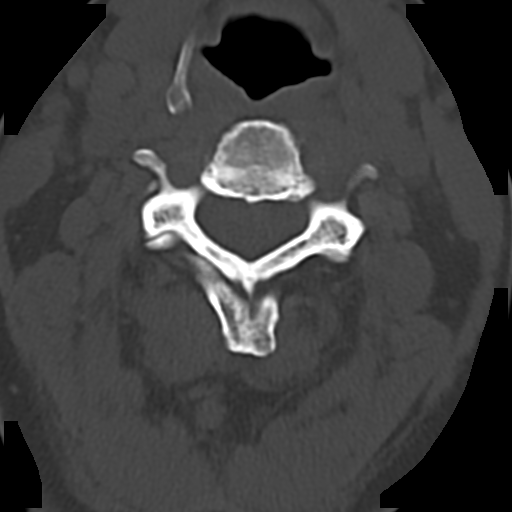
[im 84/101  soft-tissue]
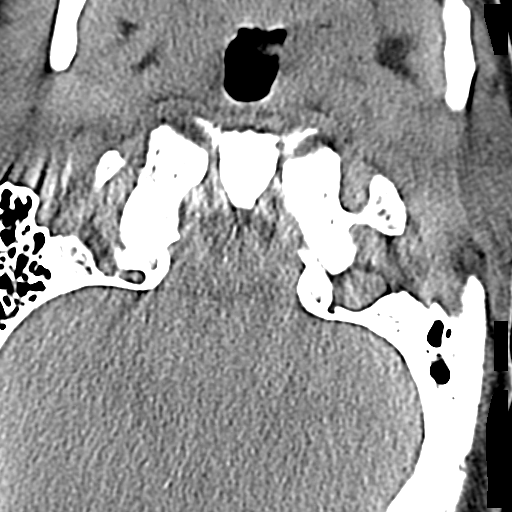
[im 84/101  bone]
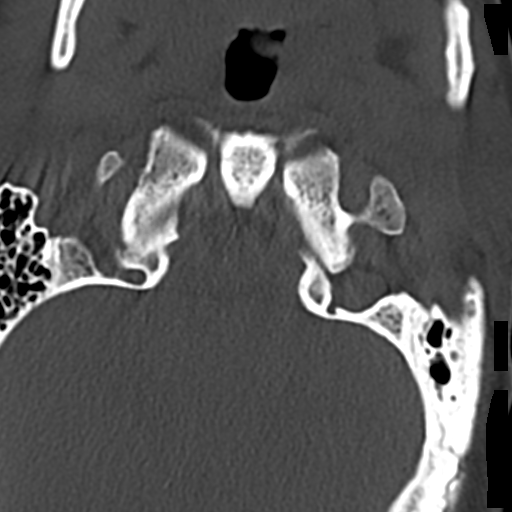

[13 of 33 positions shown; findings below may reference images not displayed]

FINDINGS: CT HEAD FINDINGS

Brain: There is no mass, hemorrhage or extra-axial collection. The
size and configuration of the ventricles and extra-axial CSF spaces
are normal. The brain parenchyma is normal, without evidence of
acute or chronic infarction.

Vascular: No abnormal hyperdensity of the major intracranial
arteries or dural venous sinuses. No intracranial atherosclerosis.

Skull: The visualized skull base, calvarium and extracranial soft
tissues are normal.

Sinuses/Orbits: No fluid levels or advanced mucosal thickening of
the visualized paranasal sinuses. No mastoid or middle ear effusion.
The orbits are normal.

CT CERVICAL SPINE FINDINGS

Alignment: No static subluxation. Facets are aligned. Occipital
condyles are normally positioned.

Skull base and vertebrae: No acute fracture.

Soft tissues and spinal canal: No prevertebral fluid or swelling. No
visible canal hematoma.

Disc levels: Mild degenerative disc disease.

Upper chest: No pneumothorax, pulmonary nodule or pleural effusion.

Other: Normal visualized paraspinal cervical soft tissues.
IMPRESSION: Normal head and cervical spine.

## 2021-01-29 ENCOUNTER — Emergency Department (HOSPITAL_COMMUNITY)
Admission: EM | Admit: 2021-01-29 | Discharge: 2021-01-29 | Discharge status: Against Medical Advice | Payer: Medicaid Other | Source: Home / Self Care

## 2021-01-30 DIAGNOSIS — M5442 Lumbago with sciatica, left side: Secondary | ICD-10-CM | POA: Diagnosis not present

## 2021-01-30 DIAGNOSIS — M5441 Lumbago with sciatica, right side: Secondary | ICD-10-CM | POA: Diagnosis not present

## 2021-05-02 DIAGNOSIS — Z139 Encounter for screening, unspecified: Secondary | ICD-10-CM | POA: Diagnosis not present

## 2021-05-26 DIAGNOSIS — Z1159 Encounter for screening for other viral diseases: Secondary | ICD-10-CM | POA: Diagnosis not present

## 2021-05-26 DIAGNOSIS — Z79899 Other long term (current) drug therapy: Secondary | ICD-10-CM | POA: Diagnosis not present

## 2021-05-26 DIAGNOSIS — Z131 Encounter for screening for diabetes mellitus: Secondary | ICD-10-CM | POA: Diagnosis not present

## 2021-05-26 DIAGNOSIS — M129 Arthropathy, unspecified: Secondary | ICD-10-CM | POA: Diagnosis not present

## 2021-05-26 DIAGNOSIS — E559 Vitamin D deficiency, unspecified: Secondary | ICD-10-CM | POA: Diagnosis not present

## 2021-05-27 DIAGNOSIS — G8929 Other chronic pain: Secondary | ICD-10-CM | POA: Diagnosis not present

## 2021-05-27 DIAGNOSIS — M542 Cervicalgia: Secondary | ICD-10-CM | POA: Diagnosis not present

## 2021-05-27 DIAGNOSIS — M5441 Lumbago with sciatica, right side: Secondary | ICD-10-CM | POA: Diagnosis not present

## 2021-05-27 DIAGNOSIS — M25562 Pain in left knee: Secondary | ICD-10-CM | POA: Diagnosis not present

## 2021-05-29 DIAGNOSIS — Z79899 Other long term (current) drug therapy: Secondary | ICD-10-CM | POA: Diagnosis not present

## 2021-05-31 DIAGNOSIS — Z79899 Other long term (current) drug therapy: Secondary | ICD-10-CM | POA: Diagnosis not present

## 2021-06-04 DIAGNOSIS — Z79899 Other long term (current) drug therapy: Secondary | ICD-10-CM | POA: Diagnosis not present

## 2021-07-02 DIAGNOSIS — Z79899 Other long term (current) drug therapy: Secondary | ICD-10-CM | POA: Diagnosis not present

## 2021-07-04 DIAGNOSIS — Z79899 Other long term (current) drug therapy: Secondary | ICD-10-CM | POA: Diagnosis not present

## 2021-11-15 ENCOUNTER — Encounter: Payer: Self-pay | Admitting: Emergency Medicine

## 2021-11-18 ENCOUNTER — Encounter (HOSPITAL_COMMUNITY): Payer: Self-pay | Admitting: Emergency Medicine

## 2021-11-18 ENCOUNTER — Emergency Department (HOSPITAL_COMMUNITY)
Admission: EM | Admit: 2021-11-18 | Discharge: 2021-11-18 | Discharge status: Against Medical Advice | Payer: Medicaid Other | Attending: Emergency Medicine | Admitting: Emergency Medicine

## 2021-11-18 ENCOUNTER — Emergency Department (HOSPITAL_COMMUNITY): Payer: Medicaid Other

## 2021-11-18 DIAGNOSIS — F410 Panic disorder [episodic paroxysmal anxiety] without agoraphobia: Secondary | ICD-10-CM | POA: Diagnosis not present

## 2021-11-18 DIAGNOSIS — R064 Hyperventilation: Secondary | ICD-10-CM | POA: Insufficient documentation

## 2021-11-18 DIAGNOSIS — R0789 Other chest pain: Secondary | ICD-10-CM | POA: Diagnosis not present

## 2021-11-18 DIAGNOSIS — R079 Chest pain, unspecified: Secondary | ICD-10-CM | POA: Diagnosis not present

## 2021-11-18 DIAGNOSIS — Z5321 Procedure and treatment not carried out due to patient leaving prior to being seen by health care provider: Secondary | ICD-10-CM | POA: Diagnosis not present

## 2021-11-18 DIAGNOSIS — R0689 Other abnormalities of breathing: Secondary | ICD-10-CM | POA: Diagnosis not present

## 2021-11-18 LAB — BASIC METABOLIC PANEL
Anion gap: 9 (ref 5–15)
BUN: 7 mg/dL (ref 6–20)
CO2: 25 mmol/L (ref 22–32)
Calcium: 9.3 mg/dL (ref 8.9–10.3)
Chloride: 104 mmol/L (ref 98–111)
Creatinine, Ser: 0.77 mg/dL (ref 0.44–1.00)
GFR, Estimated: >60 mL/min (ref >60)
Glucose, Bld: 89 mg/dL (ref 70–99)
Potassium: 3.9 mmol/L (ref 3.5–5.1)
Sodium: 138 mmol/L (ref 135–145)

## 2021-11-18 LAB — CBC
HCT: 36.9 % (ref 36.0–46.0)
Hemoglobin: 11.6 g/dL — ABNORMAL LOW (ref 12.0–15.0)
MCH: 28.4 pg (ref 26.0–34.0)
MCHC: 31.4 g/dL (ref 30.0–36.0)
MCV: 90.4 fL (ref 80.0–100.0)
Platelets: 326 10*3/uL (ref 150–400)
RBC: 4.08 MIL/uL (ref 3.87–5.11)
RDW: 13.9 % (ref 11.5–15.5)
WBC: 4 10*3/uL (ref 4.0–10.5)
nRBC: 0 % (ref 0.0–0.2)

## 2021-11-18 LAB — I-STAT BETA HCG BLOOD, ED (MC, WL, AP ONLY): I-stat hCG, quantitative: <5 m[IU]/mL (ref <5)

## 2021-11-18 LAB — TROPONIN I (HIGH SENSITIVITY)
Troponin I (High Sensitivity): 3 ng/L (ref <18)
Troponin I (High Sensitivity): 3 ng/L (ref <18)

## 2021-11-18 NOTE — ED Triage Notes (Addendum)
Patient BIB [facility] from home for evaluation of a panic attack and chest pain. On EMS arrival to patient's home patient was hyperventilation. EMS reports patient's chest pain improved as respiratory rate returned to normal. Received 324mg  ASA and 1x SL NTG PTA from EMS. Patient is alert, oriented, and in no apparent distress at this time.   Patient states she is afraid of heights and was changing a light bulb while standing on a ladder when the anxiety and pain began. BP 153/88 HR 93

## 2022-12-01 ENCOUNTER — Other Ambulatory Visit: Payer: 59 | Admitting: *Deleted

## 2022-12-01 DIAGNOSIS — Z124 Encounter for screening for malignant neoplasm of cervix: Secondary | ICD-10-CM

## 2022-12-02 NOTE — Progress Notes (Addendum)
Patient: Briana Harris           Date of Birth: [birth year]           MRN: [id number] Visit Date: 12/01/2022 PCP: Department, [facility]  Cervical Cancer Screening Do you smoke?: Yes Have you ever had or been told you have an allergy to latex products?: No Marital status: Single Date of last pap smear: Don't know Date of last menstrual period:  (01/12/2007) Number of pregnancies: 3 Number of births: 3 Have you ever had any of the following? Hysterectomy: No Tubal ligation (tubes tied): No Abnormal bleeding: No Abnormal pap smear: Don't Know Venereal warts: Don't Know A sex partner with venereal warts: No A high risk* sex partner: No  Cervical Exam  Abnormal Observations: Blood observed from OS questionable internal cervical polyp. Copious green vaginal discharge. No vaginal lesions. Recommendations: Close follow up on Pap results.     Exam completed by Dr. Neil Crouch.  Patient's History There are no problems to display for this patient.  Past Medical History:  Diagnosis Date   Anxiety    Chronic back pain    Depression    Immunization series complete     No family history on file.  Social History   Occupational History   Not on file  Tobacco Use   Smoking status: Every Day    Current packs/day: 2.00    Types: Cigarettes   Smokeless tobacco: Never  Substance and Sexual Activity   Alcohol use: No   Drug use: No    Types: Marijuana   Sexual activity: Not on file

## 2022-12-05 LAB — CYTOLOGY - PAP
Comment: NEGATIVE
Comment: NEGATIVE
Comment: NEGATIVE
HPV 16: NEGATIVE
HPV 18 / 45: NEGATIVE
High risk HPV: POSITIVE — AB

## 2022-12-09 ENCOUNTER — Telehealth: Payer: Self-pay

## 2022-12-09 NOTE — Telephone Encounter (Signed)
Attempted to contact patient to discuss lab results. Unable to reach patient, voicemail not set up. Will try to reach patient again.

## 2022-12-16 ENCOUNTER — Telehealth: Payer: Self-pay

## 2022-12-16 NOTE — Telephone Encounter (Signed)
Have made multiple attempts to reach patient to discuss lab results from free screening event. Patient's voicemail box is full and have been unable to leave a message. Mailed letter on 12/16/22 asking patient to call back to discuss lab results. Patient needs colpo and endo bx.

## 2023-01-20 ENCOUNTER — Encounter: Payer: 59 | Admitting: Family Medicine

## 2023-02-12 ENCOUNTER — Encounter (HOSPITAL_COMMUNITY): Payer: Self-pay | Admitting: Emergency Medicine

## 2023-02-12 ENCOUNTER — Emergency Department (HOSPITAL_COMMUNITY): Payer: Medicaid Other

## 2023-02-12 ENCOUNTER — Emergency Department (HOSPITAL_COMMUNITY)
Admission: EM | Admit: 2023-02-12 | Discharge: 2023-02-12 | Disposition: A | Discharge status: Home / Self Care | Payer: Medicaid Other

## 2023-02-12 DIAGNOSIS — S39012A Strain of muscle, fascia and tendon of lower back, initial encounter: Secondary | ICD-10-CM | POA: Diagnosis not present

## 2023-02-12 DIAGNOSIS — M545 Low back pain, unspecified: Secondary | ICD-10-CM | POA: Diagnosis present

## 2023-02-12 DIAGNOSIS — X58XXXA Exposure to other specified factors, initial encounter: Secondary | ICD-10-CM | POA: Diagnosis not present

## 2023-02-12 MED ORDER — HYDROMORPHONE HCL 1 MG/ML IJ SOLN
1.0000 mg | Freq: Once | INTRAMUSCULAR | Status: AC
  Administered 2023-02-12: 1 mg via INTRAMUSCULAR
  Filled 2023-02-12: qty 1

## 2023-02-12 MED ORDER — ONDANSETRON 4 MG PO TBDP
4.0000 mg | ORAL_TABLET | Freq: Once | ORAL | Status: AC
  Administered 2023-02-12: 4 mg via ORAL
  Filled 2023-02-12: qty 1

## 2023-02-12 MED ORDER — METHOCARBAMOL 500 MG PO TABS: 500.0000 mg | ORAL_TABLET | Freq: Two times a day (BID) | ORAL | 0 refills | Status: DC

## 2023-02-12 MED ORDER — KETOROLAC TROMETHAMINE 10 MG PO TABS: 10.0000 mg | ORAL_TABLET | Freq: Four times a day (QID) | ORAL | 0 refills | Status: DC | PRN

## 2023-02-12 NOTE — ED Provider Notes (Signed)
 [place] EMERGENCY DEPARTMENT AT [facility] Provider Note   CSN: [id number] Arrival date & time: 02/12/23  1727     History  Chief Complaint  Patient presents with   Back Pain    Briana Harris is a 53 y.o. female.  63 old female with no reported past medical history presenting to the emergency department today with low back pain.  The patient states that this started a few days ago.  She states that she was picking up one of her grandchildren when she noticed a catch in her back.  She states that since then she has been having difficulty standing up straight.  She denies any fevers with this.  Denies any other injuries.  She denies any fecal incontinence or saddle anesthesia but states that she has had 2 episodes where she has lost control of her bladder.  The patient states that the first time she was trying to get to the bathroom and cannot make it in time.  States that the second time that she went to the bathroom and did not notice that she had urinated a small amount before she got there.  She came to the ER today for further evaluation regarding this.  She denies any history of IV drug abuse.   Back Pain      Home Medications Prior to Admission medications   Medication Sig Start Date End Date Taking? Authorizing Provider  acetaminophen  (TYLENOL ) 500 MG tablet Take 500 mg by mouth every 6 (six) hours as needed for mild pain (pain score 1-3).   Yes [provider]  ketorolac  (TORADOL ) 10 MG tablet Take 1 tablet (10 mg total) by mouth every 6 (six) hours as needed. 02/12/23  Yes Ula Prentice SAUNDERS, MD  methocarbamol  (ROBAXIN ) 500 MG tablet Take 1 tablet (500 mg total) by mouth 2 (two) times daily. 02/12/23  Yes Ula Prentice SAUNDERS, MD  cetirizine  (ZYRTEC ) 10 MG tablet Take 1 tablet (10 mg total) by mouth daily. Patient not taking: Reported on 02/27/2019 08/30/15 02/27/19  Odis Burnard Jansky, PA-C      Allergies    Flexeril [cyclobenzaprine], Lactose intolerance (gi), and  Penicillins    Review of Systems   Review of Systems  Musculoskeletal:  Positive for back pain.  All other systems reviewed and are negative.   Physical Exam Updated Vital Signs BP (!) 133/90 (BP Location: Right Arm)   Pulse 84   Temp 98.1 F (36.7 C) (Oral)   Resp 20   LMP 07/20/2012   SpO2 100%  Physical Exam Vitals and nursing note reviewed.   Gen: NAD Eyes: PERRL, EOMI HEENT: no oropharyngeal swelling Neck: trachea midline Resp: clear to auscultation bilaterally Card: RRR, no murmurs, rubs, or gallops Abd: nontender, nondistended Extremities: no calf tenderness, no edema MSK: The patient is tender over the upper mid lumbar spine with no step-offs or deformities noted, positive straight leg raise bilaterally Vascular: 2+ radial pulses bilaterally, 2+ DP pulses bilaterally Neuro: Equal strength and sensation throughout bilateral lower extremities Skin: no rashes Psyc: acting appropriately   ED Results / Procedures / Treatments   Labs (all labs ordered are listed, but only abnormal results are displayed) Labs Reviewed  PREGNANCY, URINE    EKG None  Radiology MR LUMBAR SPINE WO CONTRAST Result Date: 02/12/2023 CLINICAL DATA:  Initial evaluation for acute myelopathy. EXAM: MRI LUMBAR SPINE WITHOUT CONTRAST TECHNIQUE: Multiplanar, multisequence MR imaging of the lumbar spine was performed. No intravenous contrast was administered. COMPARISON:  CT  from earlier the same day. FINDINGS: Segmentation: Standard. Lowest well-formed disc space labeled the L5-S1 level. Alignment: Physiologic with preservation of the normal lumbar lordosis. No listhesis. Vertebrae: Vertebral body height maintained without acute or chronic fracture. Bone marrow signal intensity within normal limits. No discrete or worrisome osseous lesions or abnormal marrow edema. Conus medullaris and cauda equina: Conus extends to the L2 level. Conus and cauda equina appear normal. Paraspinal and other soft  tissues: Unremarkable. Disc levels: L1-2: Minimal annular disc bulge. Mild facet hypertrophy. No stenosis. L2-3: Tiny left foraminal disc protrusion (series 4, image 11). Mild facet hypertrophy. No spinal stenosis. Foramina remain adequately patent. L3-4: Negative interspace. Mild facet and ligament flavum hypertrophy. No stenosis. L4-5: Disc desiccation without disc bulge. Mild facet hypertrophy. No stenosis. L5-S1: Disc desiccation with minimal annular disc bulge. Left-sided reactive endplate spurring. Moderate left worse than right facet hypertrophy with trace joint effusions. No spinal stenosis. Foramina remain patent. IMPRESSION: 1. Normal MRI appearance of the conus and cauda equina. No acute abnormality. 2. Tiny left foraminal disc protrusion at L2-3 without significant stenosis or neural impingement. 3. Moderate left worse than right facet hypertrophy at L5-S1 without stenosis. Electronically Signed   By: Morene Hoard M.D.   On: 02/12/2023 21:41   CT Lumbar Spine Wo Contrast Result Date: 02/12/2023 CLINICAL DATA:  Low back pain for 2 weeks. Pain began after picking a child 2 weeks ago and has been progressing. EXAM: CT LUMBAR SPINE WITHOUT CONTRAST TECHNIQUE: Multidetector CT imaging of the lumbar spine was performed without intravenous contrast administration. Multiplanar CT image reconstructions were also generated. RADIATION DOSE REDUCTION: This exam was performed according to the departmental dose-optimization program which includes automated exposure control, adjustment of the mA and/or kV according to patient size and/or use of iterative reconstruction technique. COMPARISON:  Lumbar spine radiographs 02/27/2019 FINDINGS: Segmentation: 5 non rib-bearing lumbar type vertebral bodies are present. The lowest fully formed vertebral body is L5. Alignment: No significant listhesis is present. Lumbar lordosis is preserved. Vertebrae: Vertebral body heights are normal. No focal osseous lesions are  present. Paraspinal and other soft tissues: Atherosclerotic calcifications are present in the distal aorta and proximal iliac vessels. No aneurysm is present. No significant adenopathy is present. Disc levels: No significant lumbar disc disease is present. Moderate facet hypertrophy is present bilaterally at L5-S1 without focal disc protrusion or stenosis. IMPRESSION: 1. No acute or focal lesion to explain the patient's symptoms. 2. Moderate facet hypertrophy bilaterally at L5-S1 without focal disc protrusion or stenosis. 3.  Aortic Atherosclerosis (ICD10-I70.0). Electronically Signed   By: Lonni Necessary M.D.   On: 02/12/2023 19:25    Procedures Procedures    Medications Ordered in ED Medications  HYDROmorphone  (DILAUDID ) injection 1 mg (1 mg Intramuscular Given 02/12/23 2027)  ondansetron  (ZOFRAN -ODT) disintegrating tablet 4 mg (4 mg Oral Given 02/12/23 2027)    ED Course/ Medical Decision Making/ A&P                                 Medical Decision Making 53 year old female with no reported past medical history presenting to the emergency department today with back pain and 2 episodes of urinary incontinence over the past few days.  The patient's neurologic exam is reassuring.  Seems like the first episode of urinary incontinence seemed more that she had to go and was slow to get to the bathroom due to her back pain.  Second episode sounds like  this could potentially be true urinary incontinence but was also a relatively similar scenario.  Given these concerns I will obtain an MRI for further evaluation for cauda equina syndrome.  She does have very reproducible chest pain here and suspicion for aortic dissection is low at this time.  She did have a CT scan ordered at triage which shows some degenerative changes but no acute fractures.  I will give the patient IM Dilaudid  for pain.  Will reevaluate for ultimate disposition.  The patient's MRI does not show any acute findings but does show  some mild degenerative changes.  She will be discharged with symptomatic treatment with return precautions.  Amount and/or Complexity of Data Reviewed Radiology: ordered.  Risk Prescription drug management.           Final Clinical Impression(s) / ED Diagnoses Final diagnoses:  Strain of lumbar region, initial encounter    Rx / DC Orders ED Discharge Orders          Ordered    ketorolac  (TORADOL ) 10 MG tablet  Every 6 hours PRN        02/12/23 2222    methocarbamol  (ROBAXIN ) 500 MG tablet  2 times daily        02/12/23 2222              Ula Prentice SAUNDERS, MD 02/12/23 2223

## 2023-02-12 NOTE — ED Triage Notes (Signed)
 PT BIB EMS for back pain x 2 weeks. Started after picking up child 2 weeks ago and pain is getting worse. Pain is in the center of back and does not radiate. OTC medications are not helping with pain

## 2023-02-12 NOTE — ED Triage Notes (Signed)
 Pt states that she does not know when she has to urinate until it is already coming out

## 2023-02-12 NOTE — ED Provider Triage Note (Signed)
 Emergency Medicine Provider Triage Evaluation Note  JAIELLE DLOUHY , a 53 y.o. female  was evaluated in triage.  Pt complains of lower back pain.  Patient had a slip and fall a couple weeks ago, initially did well with this however around Christmas time was playing with her grandkids and had acute worsening of lower back pain.  Pain radiates into buttocks and mainly down the left leg.  She is having to sleep on a bed downstairs but having to use stairs for the restroom.  Due to urgency this is resulted in urinary accidents.  But she denies any saddle anesthesia.  Review of Systems  Positive: Lower back pain Negative: Saddle anesthesia, numbness of the lower extremities  Physical Exam  BP (!) 133/90 (BP Location: Right Arm)   Pulse 84   Temp 98.1 F (36.7 C) (Oral)   Resp 20   LMP 07/20/2012   SpO2 100%  Gen:   Awake, sitting sideways in the wheelchair, appears uncomfortable Resp:  Normal effort  MSK:   Moves extremities without difficulty, diffuse tenderness to palpation of the lumbar spine, midline and paraspinal leading into the buttock/SI areas, unable to do straight leg or other testing due to the patient being in a chair Other:    Medical Decision Making  Medically screening exam initiated at 5:47 PM.  Appropriate orders placed.  CONSTANZA MINCY was informed that the remainder of the evaluation will be completed by another provider, this initial triage assessment does not replace that evaluation, and the importance of remaining in the ED until their evaluation is complete.  53 year old female presents emergency department with acutely worsening lower back pain.  Pain radiating into the buttocks, left leg.  Secondary to pain she is unable to make it to the bathroom and has had urinary accidents but does not sound like full incontinence.  No saddle anesthesia.  Patient seen briefly in triage setting, sitting in a chair. Limited physical exam and not undressed. Labs and imaging ordered to  evaluate, further workup pending results.  Patient stable for waiting room.   Bari Roxie HERO, DO 02/12/23 1749

## 2023-02-12 NOTE — Discharge Instructions (Signed)
 Your workup today was reassuring.  Please take the ketorolac  for pain.  Take the methocarbamol  as a muscle relaxer as needed.  Do not drive or drink alcohol take the muscle relaxers may make you drowsy.  Please follow-up with your doctor.  Return to the ER for worsening symptoms.

## 2023-08-28 ENCOUNTER — Ambulatory Visit (HOSPITAL_COMMUNITY)
Admission: EM | Admit: 2023-08-28 | Discharge: 2023-08-28 | Disposition: A | Discharge status: Home / Self Care | Attending: Family Medicine | Admitting: Family Medicine

## 2023-08-28 ENCOUNTER — Encounter (HOSPITAL_COMMUNITY): Payer: Self-pay | Admitting: *Deleted

## 2023-08-28 DIAGNOSIS — N761 Subacute and chronic vaginitis: Secondary | ICD-10-CM | POA: Diagnosis not present

## 2023-08-28 DIAGNOSIS — M25521 Pain in right elbow: Secondary | ICD-10-CM | POA: Diagnosis not present

## 2023-08-28 DIAGNOSIS — R87619 Unspecified abnormal cytological findings in specimens from cervix uteri: Secondary | ICD-10-CM | POA: Diagnosis not present

## 2023-08-28 DIAGNOSIS — M25522 Pain in left elbow: Secondary | ICD-10-CM | POA: Insufficient documentation

## 2023-08-28 MED ORDER — METRONIDAZOLE 500 MG PO TABS: 500.0000 mg | ORAL_TABLET | Freq: Two times a day (BID) | ORAL | 0 refills | Status: AC

## 2023-08-28 MED ORDER — PREDNISONE 20 MG PO TABS: 40.0000 mg | ORAL_TABLET | Freq: Every day | ORAL | 0 refills | Status: AC

## 2023-08-28 MED ORDER — METHOCARBAMOL 500 MG PO TABS: 500.0000 mg | ORAL_TABLET | Freq: Three times a day (TID) | ORAL | 0 refills | Status: AC | PRN

## 2023-08-28 MED ORDER — FLUCONAZOLE 150 MG PO TABS
150.0000 mg | ORAL_TABLET | ORAL | 0 refills | Status: AC
Start: 2023-08-28 — End: 2023-09-04

## 2023-08-28 NOTE — Discharge Instructions (Addendum)
 Take prednisone  20 mg--2 daily for 5 days  Methocarbamol  500 mg--1 tablet 3 times daily as needed for muscle spasms.  This medication can make you sleepy or dizzy  Take metronidazole  500 mg--1 tablet 2 times daily for 7 days.  Avoid drinking alcohol within 72 hours of taking this medication  Take fluconazole  150 mg--1 tablet every 3 days for 3 doses  I have made you an appointment with Dr. Jerilynn Buddle at [facility] for women's health care at [place] for August 5 at 1:10 PM.

## 2023-08-28 NOTE — ED Triage Notes (Signed)
 C/O vaginal discharge and odor since going through perimenopause over past couple yrs. States has felt occasionally that she may have yeast infections, but Monistat doesn't work; c/o having very foul vaginal odor with discharge intermittently x months. Also c/o internal vaginal itching when she wipes.  Also c/o bilat posterior elbow pain onset 2 nights ago. Describes as a stiffness. Denies injury. Reports occasionally BUE parasthesia, and sometimes my hands will lock up on me.

## 2023-08-28 NOTE — ED Provider Notes (Addendum)
 [facility]    CSN: [id number] Arrival date & time: 08/28/23  0909      History   Chief Complaint Chief Complaint  Patient presents with   Vaginal Discharge   Elbow Pain    HPI Briana Harris is a 53 y.o. female.    Vaginal Discharge Here for vaginal dc and itching for about 2 months. No abd pain. Some external dysuria.  Has bilateral elbow pain for a few months, but worse this last month. No trauma or fall. Taking ibuprofen , aleve , tylenol , lidocaine  patches, without relief  Some paresthesias, and hands can lock up  She is allergic to PCN and flexeril.  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 She had green dc at the time. Oncology outreach was unable to reach her about her results, needing colpo, etc.    Past Medical History:  Diagnosis Date   Anxiety    Chronic back pain    Depression    Immunization series complete     There are no active problems to display for this patient.   History reviewed. No pertinent surgical history.  OB History   No obstetric history on file.      Home Medications    Prior to Admission medications   Medication Sig Start Date End Date Taking? Authorizing Provider  acetaminophen  (TYLENOL ) 500 MG tablet Take 500 mg by mouth every 6 (six) hours as needed for mild pain (pain score 1-3).   Yes [provider]  fluconazole  (DIFLUCAN ) 150 MG tablet Take 1 tablet (150 mg total) by mouth every 3 (three) days for 3 doses. 08/28/23 09/04/23 Yes Vonna Sharlet POUR, MD  methocarbamol  (ROBAXIN ) 500 MG tablet Take 1 tablet (500 mg total) by mouth every 8 (eight) hours as needed for muscle spasms. 08/28/23  Yes Vonna Sharlet POUR, MD  metroNIDAZOLE  (FLAGYL ) 500 MG tablet Take 1 tablet (500 mg total) by mouth 2 (two) times daily for 7 days. 08/28/23 09/04/23 Yes Vonna Sharlet POUR, MD  predniSONE  (DELTASONE ) 20 MG tablet Take 2 tablets (40 mg total) by mouth daily with breakfast for 5 days. 08/28/23 09/02/23 Yes Aeriel Boulay,  Sharlet POUR, MD  cetirizine  (ZYRTEC ) 10 MG tablet Take 1 tablet (10 mg total) by mouth daily. Patient not taking: Reported on 02/27/2019 08/30/15 02/27/19  Odis Burnard Jansky, PA-C    Family History History reviewed. No pertinent family history.  Social History Social History   Tobacco Use   Smoking status: Some Days    Types: Cigarettes, Cigars   Smokeless tobacco: Never   Tobacco comments:    Quit cigarettes 1 yr ago; occasional Black & Mild smoking  Vaping Use   Vaping status: Never Used  Substance Use Topics   Alcohol use: No    Comment: quit since 2003   Drug use: No    Types: Marijuana     Allergies   Flexeril [cyclobenzaprine], Lactose intolerance (gi), and Penicillins   Review of Systems Review of Systems  Genitourinary:  Positive for vaginal discharge.     Physical Exam Triage Vital Signs ED Triage Vitals [08/28/23 0931]  Encounter Vitals Group     BP (!) 160/95     Girls Systolic BP Percentile      Girls Diastolic BP Percentile      Boys Systolic BP Percentile      Boys Diastolic BP Percentile      Pulse Rate 66     Resp 16     Temp 98 F (36.7  C)     Temp Source Oral     SpO2 98 %     Weight      Height      Head Circumference      Peak Flow      Pain Score 8     Pain Loc      Pain Education      Exclude from Growth Chart    No data found.  Updated Vital Signs BP (!) 160/95   Pulse 66   Temp 98 F (36.7 C) (Oral)   Resp 16   LMP 07/20/2012   SpO2 98%   Visual Acuity Right Eye Distance:   Left Eye Distance:   Bilateral Distance:    Right Eye Near:   Left Eye Near:    Bilateral Near:     Physical Exam Vitals reviewed.  Constitutional:      General: She is not in acute distress.    Appearance: She is not ill-appearing, toxic-appearing or diaphoretic.  HENT:     Mouth/Throat:     Mouth: Mucous membranes are moist.  Eyes:     Extraocular Movements: Extraocular movements intact.     Conjunctiva/sclera: Conjunctivae normal.      Pupils: Pupils are equal, round, and reactive to light.  Cardiovascular:     Rate and Rhythm: Normal rate and regular rhythm.     Heart sounds: No murmur heard. Pulmonary:     Effort: Pulmonary effort is normal.     Breath sounds: Normal breath sounds.  Musculoskeletal:     Cervical back: Neck supple.     Comments: Some tenderness of posterior elbow, just proximal to the elbow joints bilaterally. No erythema/deformity/swelling  Lymphadenopathy:     Cervical: No cervical adenopathy.  Skin:    Coloration: Skin is not jaundiced or pale.  Neurological:     General: No focal deficit present.     Mental Status: She is alert and oriented to person, place, and time.  Psychiatric:        Behavior: Behavior normal.      UC Treatments / Results  Labs (all labs ordered are listed, but only abnormal results are displayed) Labs Reviewed  CERVICOVAGINAL ANCILLARY ONLY    EKG   Radiology No results found.  Procedures Procedures (including critical care time)  Medications Ordered in UC Medications - No data to display  Initial Impression / Assessment and Plan / UC Course  I have reviewed the triage vital signs and the nursing notes.  Pertinent labs & imaging results that were available during my care of the patient were reviewed by me and considered in my medical decision making (see chart for details).     She declines my offer of Toradol . 5 day burst of prednisone  sent along with methocarbamol  for the elbow pain. She feels some of the discomfort is muscle spasm.  Vaginal self swab is done, and we will notify of any positives on that and treat per protocol. Flagyl  is sent in for possible BV as she does have some odor and fluconazole  was sent in for 3 doses.  I have called and made her an appointment to follow-up her abnormal Pap smear from 9 months ago with [facility]  She is given instructions on how to set up primary care on our  website also. Final Clinical Impressions(s) / UC Diagnoses   Final diagnoses:  Subacute vaginitis  Bilateral elbow joint pain  Abnormal cervical Papanicolaou smear,  unspecified abnormal pap finding     Discharge Instructions      Take prednisone  20 mg--2 daily for 5 days  Methocarbamol  500 mg--1 tablet 3 times daily as needed for muscle spasms.  This medication can make you sleepy or dizzy  Take metronidazole  500 mg--1 tablet 2 times daily for 7 days.  Avoid drinking alcohol within 72 hours of taking this medication  Take fluconazole  150 mg--1 tablet every 3 days for 3 doses  I have made you an appointment with Dr. Jerilynn Buddle at [facility] at [place] for August 5 at 1:10 PM.      ED Prescriptions     Medication Sig Dispense Auth. Provider   metroNIDAZOLE  (FLAGYL ) 500 MG tablet Take 1 tablet (500 mg total) by mouth 2 (two) times daily for 7 days. 14 tablet Hadyn Blanck K, MD   fluconazole  (DIFLUCAN ) 150 MG tablet Take 1 tablet (150 mg total) by mouth every 3 (three) days for 3 doses. 3 tablet Vonna Sharlet POUR, MD   predniSONE  (DELTASONE ) 20 MG tablet Take 2 tablets (40 mg total) by mouth daily with breakfast for 5 days. 10 tablet Vonna Sharlet POUR, MD   methocarbamol  (ROBAXIN ) 500 MG tablet Take 1 tablet (500 mg total) by mouth every 8 (eight) hours as needed for muscle spasms. 15 tablet Marzelle Rutten K, MD      PDMP not reviewed this encounter.   Vonna Sharlet POUR, MD 08/28/23 1042    Vonna Sharlet POUR, MD 08/28/23 [phone]

## 2023-08-29 LAB — CERVICOVAGINAL ANCILLARY ONLY
Bacterial Vaginitis (gardnerella): NEGATIVE
Candida Glabrata: NEGATIVE
Candida Vaginitis: POSITIVE — AB
Chlamydia: NEGATIVE
Comment: NEGATIVE
Comment: NEGATIVE
Comment: NEGATIVE
Comment: NEGATIVE
Comment: NEGATIVE
Comment: NORMAL
Neisseria Gonorrhea: NEGATIVE
Trichomonas: POSITIVE — AB

## 2023-09-01 ENCOUNTER — Ambulatory Visit (HOSPITAL_COMMUNITY): Payer: Self-pay

## 2023-09-09 ENCOUNTER — Encounter: Admitting: Obstetrics and Gynecology
